# Patient Record
Sex: Female | Born: 1940 | Race: White | Hispanic: No | Marital: Married | Smoking: Never smoker
Health system: Southern US, Community
[De-identification: ages and names within clinical notes are randomized; demographics above are authoritative.]

## PROBLEM LIST (undated history)

## (undated) DIAGNOSIS — I10 Essential (primary) hypertension: Secondary | ICD-10-CM

## (undated) DIAGNOSIS — S7401XA 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, right leg, initial encounter: Secondary | ICD-10-CM

## (undated) DIAGNOSIS — Z8711 Personal history of peptic ulcer disease: Secondary | ICD-10-CM

## (undated) DIAGNOSIS — R079 Chest pain, unspecified: Secondary | ICD-10-CM

## (undated) DIAGNOSIS — A833 St Louis encephalitis: Secondary | ICD-10-CM

## (undated) DIAGNOSIS — M152 Bouchard's nodes (with arthropathy): Secondary | ICD-10-CM

## (undated) DIAGNOSIS — Z8719 Personal history of other diseases of the digestive system: Secondary | ICD-10-CM

## (undated) DIAGNOSIS — J302 Other seasonal allergic rhinitis: Secondary | ICD-10-CM

## (undated) DIAGNOSIS — E785 Hyperlipidemia, unspecified: Secondary | ICD-10-CM

## (undated) DIAGNOSIS — K5792 Diverticulitis of intestine, part unspecified, without perforation or abscess without bleeding: Secondary | ICD-10-CM

## (undated) DIAGNOSIS — K5730 Diverticulosis of large intestine without perforation or abscess without bleeding: Secondary | ICD-10-CM

## (undated) DIAGNOSIS — M5136 Other intervertebral disc degeneration, lumbar region: Secondary | ICD-10-CM

## (undated) DIAGNOSIS — M51369 Other intervertebral disc degeneration, lumbar region without mention of lumbar back pain or lower extremity pain: Secondary | ICD-10-CM

## (undated) DIAGNOSIS — M3500 Sicca syndrome, unspecified: Secondary | ICD-10-CM

## (undated) DIAGNOSIS — G4733 Obstructive sleep apnea (adult) (pediatric): Secondary | ICD-10-CM

## (undated) DIAGNOSIS — D126 Benign neoplasm of colon, unspecified: Secondary | ICD-10-CM

## (undated) DIAGNOSIS — M1990 Unspecified osteoarthritis, unspecified site: Secondary | ICD-10-CM

## (undated) DIAGNOSIS — H04129 Dry eye syndrome of unspecified lacrimal gland: Secondary | ICD-10-CM

## (undated) DIAGNOSIS — M329 Systemic lupus erythematosus, unspecified: Secondary | ICD-10-CM

## (undated) HISTORY — DX: Chest pain, unspecified: R07.9

## (undated) HISTORY — DX: Personal history of other diseases of the digestive system: Z87.19

## (undated) HISTORY — DX: Bouchard's nodes (with arthropathy): M15.2

## (undated) HISTORY — PX: TOTAL ABDOMINAL HYSTERECTOMY: SHX209

## (undated) HISTORY — DX: Other seasonal allergic rhinitis: J30.2

## (undated) HISTORY — DX: Diverticulosis of large intestine without perforation or abscess without bleeding: K57.30

## (undated) HISTORY — DX: Diverticulitis of intestine, part unspecified, without perforation or abscess without bleeding: K57.92

## (undated) HISTORY — DX: St Louis encephalitis: A83.3

## (undated) HISTORY — DX: Personal history of peptic ulcer disease: Z87.11

## (undated) HISTORY — DX: Obstructive sleep apnea (adult) (pediatric): G47.33

## (undated) HISTORY — DX: Other intervertebral disc degeneration, lumbar region without mention of lumbar back pain or lower extremity pain: M51.369

## (undated) HISTORY — DX: Essential (primary) hypertension: I10

## (undated) HISTORY — PX: CARPAL TUNNEL RELEASE: SHX101

## (undated) HISTORY — PX: REDUCTION MAMMAPLASTY: SUR839

## (undated) HISTORY — DX: Hyperlipidemia, unspecified: E78.5

## (undated) HISTORY — PX: COLONOSCOPY: SHX174

## (undated) HISTORY — DX: 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, right leg, initial encounter: S74.01XA

## (undated) HISTORY — DX: Benign neoplasm of colon, unspecified: D12.6

## (undated) HISTORY — PX: CHOLECYSTECTOMY: SHX55

## (undated) HISTORY — PX: DILATION AND CURETTAGE OF UTERUS: SHX78

## (undated) HISTORY — DX: Other intervertebral disc degeneration, lumbar region: M51.36

## (undated) HISTORY — PX: CYSTOCELE REPAIR: SHX163

## (undated) HISTORY — DX: Dry eye syndrome of unspecified lacrimal gland: H04.129

---

## 1997-08-06 ENCOUNTER — Inpatient Hospital Stay (HOSPITAL_COMMUNITY): Admission: EM | Admit: 1997-08-06 | Discharge: 1997-08-07 | Payer: Self-pay | Admitting: Urology

## 2000-01-23 DIAGNOSIS — R079 Chest pain, unspecified: Secondary | ICD-10-CM

## 2000-01-23 DIAGNOSIS — D126 Benign neoplasm of colon, unspecified: Secondary | ICD-10-CM

## 2000-01-23 HISTORY — DX: Benign neoplasm of colon, unspecified: D12.6

## 2000-01-23 HISTORY — DX: Chest pain, unspecified: R07.9

## 2000-09-20 ENCOUNTER — Encounter: Admission: RE | Admit: 2000-09-20 | Discharge: 2000-09-20 | Payer: Self-pay | Admitting: Internal Medicine

## 2000-09-20 ENCOUNTER — Encounter: Payer: Self-pay | Admitting: Internal Medicine

## 2000-10-17 ENCOUNTER — Encounter: Admission: RE | Admit: 2000-10-17 | Discharge: 2000-10-17 | Payer: Self-pay | Admitting: Internal Medicine

## 2000-10-17 ENCOUNTER — Encounter: Payer: Self-pay | Admitting: Internal Medicine

## 2000-11-18 ENCOUNTER — Encounter (INDEPENDENT_AMBULATORY_CARE_PROVIDER_SITE_OTHER): Payer: Self-pay

## 2000-11-18 ENCOUNTER — Ambulatory Visit (HOSPITAL_COMMUNITY): Admission: RE | Admit: 2000-11-18 | Discharge: 2000-11-18 | Payer: Self-pay | Admitting: Gastroenterology

## 2001-11-14 ENCOUNTER — Encounter: Admission: RE | Admit: 2001-11-14 | Discharge: 2001-11-14 | Payer: Self-pay | Admitting: Internal Medicine

## 2001-11-14 ENCOUNTER — Encounter: Payer: Self-pay | Admitting: Internal Medicine

## 2002-11-12 ENCOUNTER — Ambulatory Visit (HOSPITAL_COMMUNITY): Admission: RE | Admit: 2002-11-12 | Discharge: 2002-11-12 | Payer: Self-pay | Admitting: Plastic Surgery

## 2002-11-16 ENCOUNTER — Encounter: Payer: Self-pay | Admitting: Plastic Surgery

## 2002-11-16 ENCOUNTER — Encounter: Admission: RE | Admit: 2002-11-16 | Discharge: 2002-11-16 | Payer: Self-pay | Admitting: Plastic Surgery

## 2002-12-23 HISTORY — PX: BREAST REDUCTION SURGERY: SHX8

## 2003-07-23 HISTORY — PX: OTHER SURGICAL HISTORY: SHX169

## 2003-11-17 ENCOUNTER — Encounter: Admission: RE | Admit: 2003-11-17 | Discharge: 2003-11-17 | Payer: Self-pay | Admitting: Internal Medicine

## 2004-01-23 DIAGNOSIS — H04129 Dry eye syndrome of unspecified lacrimal gland: Secondary | ICD-10-CM

## 2004-01-23 HISTORY — DX: Dry eye syndrome of unspecified lacrimal gland: H04.129

## 2004-05-30 ENCOUNTER — Encounter: Admission: RE | Admit: 2004-05-30 | Discharge: 2004-05-30 | Payer: Self-pay | Admitting: Gastroenterology

## 2004-11-17 ENCOUNTER — Encounter: Admission: RE | Admit: 2004-11-17 | Discharge: 2004-11-17 | Payer: Self-pay | Admitting: Internal Medicine

## 2004-11-30 ENCOUNTER — Encounter: Admission: RE | Admit: 2004-11-30 | Discharge: 2004-11-30 | Payer: Self-pay | Admitting: Internal Medicine

## 2005-05-30 ENCOUNTER — Encounter: Admission: RE | Admit: 2005-05-30 | Discharge: 2005-05-30 | Payer: Self-pay | Admitting: Internal Medicine

## 2005-11-19 ENCOUNTER — Encounter: Admission: RE | Admit: 2005-11-19 | Discharge: 2005-11-19 | Payer: Self-pay | Admitting: Internal Medicine

## 2006-04-12 ENCOUNTER — Encounter: Admission: RE | Admit: 2006-04-12 | Discharge: 2006-04-12 | Payer: Self-pay | Admitting: Internal Medicine

## 2006-05-23 ENCOUNTER — Encounter: Admission: RE | Admit: 2006-05-23 | Discharge: 2006-05-23 | Payer: Self-pay | Admitting: Neurosurgery

## 2006-11-28 ENCOUNTER — Encounter: Admission: RE | Admit: 2006-11-28 | Discharge: 2006-11-28 | Payer: Self-pay | Admitting: Internal Medicine

## 2007-12-01 ENCOUNTER — Encounter: Admission: RE | Admit: 2007-12-01 | Discharge: 2007-12-01 | Payer: Self-pay | Admitting: Internal Medicine

## 2007-12-24 ENCOUNTER — Encounter: Admission: RE | Admit: 2007-12-24 | Discharge: 2007-12-24 | Payer: Self-pay | Admitting: Internal Medicine

## 2008-12-01 ENCOUNTER — Encounter: Admission: RE | Admit: 2008-12-01 | Discharge: 2008-12-01 | Payer: Self-pay | Admitting: Internal Medicine

## 2009-12-06 ENCOUNTER — Encounter: Admission: RE | Admit: 2009-12-06 | Discharge: 2009-12-06 | Payer: Self-pay | Admitting: Internal Medicine

## 2010-02-12 ENCOUNTER — Encounter: Payer: Self-pay | Admitting: Gastroenterology

## 2010-06-09 NOTE — Procedures (Signed)
[facility]  Patient:    Kerry Foster, LESH Visit Number: [id number] MRN: [id number]          Service Type: Attending:  Verlin Grills, M.D. Dictated by:   Verlin Grills, M.D. Proc. Date: 11/18/00   CC:         Thora Lance, M.D.                           Procedure Report  PROCEDURE:  Colonoscopy and sigmoid polypectomy.  REFERRING PHYSICIAN:  Thora Lance, M.D.  INDICATIONS FOR PROCEDURE:  The patient (date of birth July 15, 1940) is a 70 year old female who is referred for surveillance colonoscopy with polypectomy to prevent colon cancer.  The patients father has undergone colonoscopic exams to remove colon polyps.  She denies a family history of colon cancer.  I discussed with the patient the complications associated with colonoscopy and polypectomy including a 15 per 1000 risk of bleeding and 4 per 1000 risk of colon perforation requiring surgical repair.  The patient has signed the operative permit.  ENDOSCOPIST:  Verlin Grills, M.D.  PREMEDICATION:  Versed 5 mg and Demerol 50 mg.  ENDOSCOPE:  Olympus pediatric colonoscope.  DESCRIPTION OF PROCEDURE:  After obtaining informed consent, the patient was placed in the left lateral decubitus position.  I administered intravenous Versed and intravenous Demerol to achieve conscious sedation for the procedure.  The patients blood pressure, oxygen saturation and cardiac rhythm were monitored throughout the procedure and documented in the medical record.  Anal inspection was normal.  Digital rectal exam was normal.  The Olympus pediatric video colonoscope was introduced into the rectum and easily advanced to the cecum with the patient in the left lateral decubitus position.  Colonic preparation for the exam today was excellent.  The patient has universal colonic diverticulosis without evidence of diverticulitis or diverticular stricture.  Rectum normal.  Sigmoid  colon and descending colon:  From the distal sigmoid colon, at 30 cm from the anal verge, a 2 mm sessile polyp was removed with the electrocautery snare and submitted for pathologic interpretation.  Splenic flexure normal.  Transverse colon normal.  Hepatic flexure normal.  Ascending colon normal.  Cecum and ileocecal valve normal.  ASSESSMENT: 1. Universal colonic diverticulosis. 2. From the distal sigmoid colon, at 30 cm from the anal verge, a 2 mm sessile    polyp was removed with electrocautery snare.  RECOMMENDATIONS:  If the distal sigmoid colon polyp returns neoplastic pathologically, the patient should undergo a repeat colonoscopy in five years. Dictated by:   Verlin Grills, M.D. Attending:  Verlin Grills, M.D. DD:  11/18/00 TD:  11/19/00 Job: [id number]

## 2010-11-06 ENCOUNTER — Other Ambulatory Visit: Payer: Self-pay | Admitting: Internal Medicine

## 2010-11-06 DIAGNOSIS — Z1231 Encounter for screening mammogram for malignant neoplasm of breast: Secondary | ICD-10-CM

## 2010-12-08 ENCOUNTER — Ambulatory Visit
Admission: RE | Admit: 2010-12-08 | Discharge: 2010-12-08 | Disposition: A | Discharge status: Home / Self Care | Payer: PRIVATE HEALTH INSURANCE | Source: Ambulatory Visit | Attending: Internal Medicine | Admitting: Internal Medicine

## 2010-12-08 DIAGNOSIS — Z1231 Encounter for screening mammogram for malignant neoplasm of breast: Secondary | ICD-10-CM

## 2010-12-12 ENCOUNTER — Other Ambulatory Visit: Payer: Self-pay | Admitting: Gastroenterology

## 2011-10-29 ENCOUNTER — Other Ambulatory Visit: Payer: Self-pay | Admitting: Internal Medicine

## 2011-10-29 DIAGNOSIS — Z1231 Encounter for screening mammogram for malignant neoplasm of breast: Secondary | ICD-10-CM

## 2011-12-10 ENCOUNTER — Ambulatory Visit
Admission: RE | Admit: 2011-12-10 | Discharge: 2011-12-10 | Disposition: A | Discharge status: Home / Self Care | Payer: Medicare Other | Source: Ambulatory Visit | Attending: Internal Medicine | Admitting: Internal Medicine

## 2011-12-10 DIAGNOSIS — Z1231 Encounter for screening mammogram for malignant neoplasm of breast: Secondary | ICD-10-CM

## 2012-11-11 ENCOUNTER — Other Ambulatory Visit: Payer: Self-pay

## 2012-11-11 DIAGNOSIS — Z1231 Encounter for screening mammogram for malignant neoplasm of breast: Secondary | ICD-10-CM

## 2012-12-12 ENCOUNTER — Ambulatory Visit
Admission: RE | Admit: 2012-12-12 | Discharge: 2012-12-12 | Disposition: A | Discharge status: Home / Self Care | Payer: Medicare Other | Source: Ambulatory Visit

## 2012-12-12 DIAGNOSIS — Z1231 Encounter for screening mammogram for malignant neoplasm of breast: Secondary | ICD-10-CM

## 2013-11-09 ENCOUNTER — Other Ambulatory Visit: Payer: Self-pay

## 2013-11-09 DIAGNOSIS — Z1231 Encounter for screening mammogram for malignant neoplasm of breast: Secondary | ICD-10-CM

## 2013-12-14 ENCOUNTER — Ambulatory Visit
Admission: RE | Admit: 2013-12-14 | Discharge: 2013-12-14 | Disposition: A | Discharge status: Home / Self Care | Payer: 59 | Source: Ambulatory Visit

## 2013-12-14 DIAGNOSIS — Z1231 Encounter for screening mammogram for malignant neoplasm of breast: Secondary | ICD-10-CM

## 2014-02-22 ENCOUNTER — Other Ambulatory Visit: Payer: Self-pay | Admitting: Otolaryngology

## 2014-02-22 DIAGNOSIS — R221 Localized swelling, mass and lump, neck: Secondary | ICD-10-CM

## 2014-03-02 ENCOUNTER — Ambulatory Visit
Admission: RE | Admit: 2014-03-02 | Discharge: 2014-03-02 | Disposition: A | Discharge status: Home / Self Care | Payer: Medicare Other | Source: Ambulatory Visit | Attending: Otolaryngology | Admitting: Otolaryngology

## 2014-03-02 DIAGNOSIS — R221 Localized swelling, mass and lump, neck: Secondary | ICD-10-CM

## 2014-03-02 MED ORDER — IOHEXOL 300 MG/ML  SOLN: 75.0000 mL | Freq: Once | INTRAMUSCULAR | Status: AC | PRN

## 2014-10-18 DIAGNOSIS — I10 Essential (primary) hypertension: Secondary | ICD-10-CM | POA: Insufficient documentation

## 2014-10-26 ENCOUNTER — Encounter: Payer: Self-pay | Admitting: Cardiology

## 2014-10-28 ENCOUNTER — Encounter: Payer: Self-pay | Admitting: Cardiology

## 2014-10-28 DIAGNOSIS — R079 Chest pain, unspecified: Secondary | ICD-10-CM | POA: Insufficient documentation

## 2014-10-28 DIAGNOSIS — I10 Essential (primary) hypertension: Secondary | ICD-10-CM | POA: Insufficient documentation

## 2014-11-03 ENCOUNTER — Ambulatory Visit: Payer: Medicare Other | Admitting: Cardiology

## 2014-11-05 ENCOUNTER — Encounter: Payer: Self-pay | Admitting: Cardiology

## 2014-11-05 ENCOUNTER — Ambulatory Visit (INDEPENDENT_AMBULATORY_CARE_PROVIDER_SITE_OTHER): Payer: Medicare Other | Admitting: Cardiology

## 2014-11-05 VITALS — BP 140/50 | HR 55 | Ht 63.5 in | Wt 181.0 lb

## 2014-11-05 DIAGNOSIS — E785 Hyperlipidemia, unspecified: Secondary | ICD-10-CM

## 2014-11-05 DIAGNOSIS — I10 Essential (primary) hypertension: Secondary | ICD-10-CM

## 2014-11-05 DIAGNOSIS — R0609 Other forms of dyspnea: Secondary | ICD-10-CM

## 2014-11-05 DIAGNOSIS — R072 Precordial pain: Secondary | ICD-10-CM | POA: Diagnosis not present

## 2014-11-05 DIAGNOSIS — M3500 Sicca syndrome, unspecified: Secondary | ICD-10-CM

## 2014-11-05 DIAGNOSIS — Z8249 Family history of ischemic heart disease and other diseases of the circulatory system: Secondary | ICD-10-CM

## 2014-11-05 DIAGNOSIS — M329 Systemic lupus erythematosus, unspecified: Secondary | ICD-10-CM

## 2014-11-05 NOTE — Progress Notes (Signed)
Patient ID: Kerry Foster, female   DOB: 02-15-1940, 74 y.o.   MRN: [id number]      Cardiology Office Note  Date:  11/05/2014   ID:  Kerry, Foster January 24, 1940, MRN [id number]  PCP:  Kerry Shelling, MD  Cardiologist: Kerry Spark, MD   Chief complain: jaw pain   History of Present Illness: Kerry Foster is a 75 y.o. female with h/o borderline elevated lipids, treated hypertension, who presents for evaluation of exertional jaw pain, that occurs on minimal exertion like walking in a grocery store or cleaning dishes. She has h/o SLE and Sjogren disease on Plaquenil. She was exercising in the past but not lately as she was having problems with joints pain and bursitis. She has noticed DOE. She has occasional rapid beats followed by slow HR, not associated with dizziness or syncope. No LE edema, orthopnea or PND.  Her father had several MIs, the first at age 6. Brother underwent RFA for a-fib.  Past Medical History  Diagnosis Date  . Hypertension   . Dry eye syndrome 2006    plugs inserted and tear ducts  (hecker)  . 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 Lumbar degenerative disc disease     facet DJD.Joya Salm)  . Hx of gastroesophageal reflux (GERD)     hiatal hernia  . Moderate obstructive sleep apnea     CPAP  . Bouchard nodes (DJD hand)   . Hyperlipidemia   . Pancolonic diverticulosis     several episodes  . Diverticulitis   . Adenomatous colon polyp 2002  . Chest pain 2002    negative cardiolite  . Hx of peptic ulcer   . Seasonal allergic rhinitis   . 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 Total abdominal hysterectomy      without oophorectomy, fibroids and endometriosis age 48  .  Dilation and curettage of uterus      prior to hysterectomy  . Cholecystectomy    . Carpal tunnel release Right     carpal tunnel repair  . Cystocele repair    . Breast reduction surgery  12/2002    Holernes  . Pre melanoma Left 07/2003    left back   . Colonoscopy      with polyp resection     Current Outpatient Prescriptions  Medication Sig Dispense Refill  . amLODipine (NORVASC) 5 MG tablet Take 5 mg by mouth daily.    Marland Kitchen aspirin EC 81 MG tablet Take 81 mg by mouth daily.     . cholecalciferol (VITAMIN D) 1000 UNITS tablet Take 1,000 Units by mouth daily.    . cycloSPORINE (RESTASIS) 0.05 % ophthalmic emulsion Place 1 drop into both eyes 2 (two) times daily.    . hydroxychloroquine (PLAQUENIL) 200 MG tablet Take by mouth 2 (two) times daily.    . Ibuprofen (ADVIL) 200 MG CAPS Take 200 mg by mouth every 6 (six) hours as needed (pain).     . Multiple Vitamin (MULTIVITAMIN) tablet Take 1 tablet by mouth daily.    Marland Kitchen NITROSTAT 0.4 MG SL tablet Place 0.4 mg under the tongue every 5 (five) minutes as needed for chest pain.      No current  facility-administered medications for this visit.    Allergies:   Diuretic; Lisinopril; and Diovan    Social History:  The patient  reports that she has never smoked. She does not have any smokeless tobacco history on file. She reports that she does not drink alcohol or use illicit drugs.   Family History:  The patient's family history includes Aneurysm in her mother and mother; CAD in her father; CVA in her mother; Colon polyps in her father; Diabetes in her father; Diabetes Mellitus I in her brother; Fibromyalgia in her sister; Heart attack in her father; Hypertension in her brother, brother, and father; Kidney failure in her father; Lupus in her sister; Melanoma in her brother, brother, and sister; Pancreatitis in her mother; Prostate cancer in her brother; Sleep apnea in her brother and sister; Stroke in her father and mother.    ROS:  Please see  the history of present illness.   Otherwise, review of systems are positive for none.   All other systems are reviewed and negative.   PHYSICAL EXAM: VS:  BP 140/50 mmHg  Pulse 55  Ht 5' 3.5" (1.613 m)  Wt 181 lb (82.101 kg)  BMI 31.56 kg/m2 , BMI Body mass index is 31.56 kg/(m^2). GEN: Well nourished, well developed, in no acute distress HEENT: normal Neck: no JVD, carotid bruits, or masses Cardiac: RRR; no murmurs, rubs, or gallops,no edema  Respiratory:  clear to auscultation bilaterally, normal work of breathing GI: soft, nontender, nondistended, + BS MS: no deformity or atrophy Skin: warm and dry, no rash Neuro:  Strength and sensation are intact Psych: euthymic mood, full affect  EKG:  EKG is ordered today. The ekg ordered today demonstrates SB, low voltage ECG.   Recent Labs: No results found for requested labs within last 365 days.   Lipid Panel No results found for: CHOL, TRIG, HDL, CHOLHDL, VLDL, LDLCALC, LDLDIRECT   Wt Readings from Last 3 Encounters:  11/05/14 181 lb (82.101 kg)     ASSESSMENT AND PLAN:  1.  Chest pain, DOE - risk factors include her age, FH of premature CAD, HTN, HLP and SLE. We will schedule a Lexiscan nuclear stress test.  2. DOE - low voltage ECG< schedule an echocardiogram to evaluate for systolic and diastolic function.   3. Hyperlipidemia - we will check lipids and CMP.  Follow up in 1 month.  Signed, Kerry Spark, MD  11/05/2014 9:10 AM    [facility] [place], [place], [place]  [address] Phone: [phone]; Fax: [phone]

## 2014-11-05 NOTE — Patient Instructions (Addendum)
Medication Instructions:   Your physician recommends that you continue on your current medications as directed. Please refer to the Current Medication list given to you today.    Labwork:  ONE MONTH, SAME DAY AS YOUR ONE MONTH FOLLOW-UP APPOINTMENT WITH DR Meda Coffee TO CHECK A CMET AND LIPIDS---PLEASE COME FASTING TO THIS LAB APPOINTMENT    Testing/Procedures:  Your physician has requested that you have an echocardiogram. Echocardiography is a painless test that uses sound waves to create images of your heart. It provides your doctor with information about the size and shape of your heart and how well your heart's chambers and valves are working. This procedure takes approximately one hour. There are no restrictions for this procedure.    Your physician has requested that you have a lexiscan myoview. For further information please visit [email or web address]. Please follow instruction sheet, as given.     Follow-Up:  [place] Meda Coffee

## 2014-11-10 ENCOUNTER — Other Ambulatory Visit: Payer: Self-pay

## 2014-11-10 DIAGNOSIS — Z1231 Encounter for screening mammogram for malignant neoplasm of breast: Secondary | ICD-10-CM

## 2014-11-17 ENCOUNTER — Telehealth (HOSPITAL_COMMUNITY): Payer: Self-pay | Admitting: *Deleted

## 2014-11-17 NOTE — Telephone Encounter (Signed)
Attempted to call patient regarding upcoming appointment- no answer x 2. Amyjo Mizrachi J Edison Nicholson, RN 

## 2014-11-18 ENCOUNTER — Telehealth (HOSPITAL_COMMUNITY): Payer: Self-pay

## 2014-11-18 NOTE — Telephone Encounter (Signed)
Attempted to call the patient regarding upcoming appointments with no answer x numerous rings, then Foster recording to please enter remote access code. Kerry Foster, Kerry Foster

## 2014-11-22 ENCOUNTER — Ambulatory Visit (HOSPITAL_COMMUNITY): Payer: Medicare Other | Attending: Internal Medicine

## 2014-11-22 ENCOUNTER — Ambulatory Visit (HOSPITAL_BASED_OUTPATIENT_CLINIC_OR_DEPARTMENT_OTHER): Payer: Medicare Other

## 2014-11-22 ENCOUNTER — Other Ambulatory Visit: Payer: Self-pay

## 2014-11-22 DIAGNOSIS — R0609 Other forms of dyspnea: Secondary | ICD-10-CM | POA: Diagnosis not present

## 2014-11-22 DIAGNOSIS — R002 Palpitations: Secondary | ICD-10-CM | POA: Diagnosis not present

## 2014-11-22 DIAGNOSIS — I071 Rheumatic tricuspid insufficiency: Secondary | ICD-10-CM | POA: Insufficient documentation

## 2014-11-22 DIAGNOSIS — I340 Nonrheumatic mitral (valve) insufficiency: Secondary | ICD-10-CM | POA: Insufficient documentation

## 2014-11-22 DIAGNOSIS — R072 Precordial pain: Secondary | ICD-10-CM | POA: Insufficient documentation

## 2014-11-22 DIAGNOSIS — E785 Hyperlipidemia, unspecified: Secondary | ICD-10-CM | POA: Insufficient documentation

## 2014-11-22 DIAGNOSIS — I517 Cardiomegaly: Secondary | ICD-10-CM | POA: Diagnosis not present

## 2014-11-22 DIAGNOSIS — I10 Essential (primary) hypertension: Secondary | ICD-10-CM

## 2014-11-22 DIAGNOSIS — Z8249 Family history of ischemic heart disease and other diseases of the circulatory system: Secondary | ICD-10-CM | POA: Insufficient documentation

## 2014-11-22 DIAGNOSIS — I371 Nonrheumatic pulmonary valve insufficiency: Secondary | ICD-10-CM | POA: Insufficient documentation

## 2014-11-22 DIAGNOSIS — I5189 Other ill-defined heart diseases: Secondary | ICD-10-CM | POA: Insufficient documentation

## 2014-11-22 LAB — MYOCARDIAL PERFUSION IMAGING
LV dias vol: 73 mL
LV sys vol: 17 mL
Peak HR: 76 {beats}/min
RATE: 0.29
Rest HR: 54 {beats}/min
SDS: 0
SRS: 1
SSS: 1
TID: 0.88

## 2014-11-22 MED ORDER — REGADENOSON 0.4 MG/5ML IV SOLN
0.4000 mg | Freq: Once | INTRAVENOUS | Status: AC
  Administered 2014-11-22: 0.4 mg via INTRAVENOUS

## 2014-11-22 MED ORDER — TECHNETIUM TC 99M SESTAMIBI GENERIC - CARDIOLITE
11.0000 | Freq: Once | INTRAVENOUS | Status: AC | PRN
  Administered 2014-11-22: 11 via INTRAVENOUS

## 2014-11-22 MED ORDER — TECHNETIUM TC 99M SESTAMIBI GENERIC - CARDIOLITE
30.6000 | Freq: Once | INTRAVENOUS | Status: AC | PRN
  Administered 2014-11-22: 31 via INTRAVENOUS

## 2014-11-23 ENCOUNTER — Telehealth: Payer: Self-pay | Admitting: *Deleted

## 2014-11-23 MED ORDER — FUROSEMIDE 20 MG PO TABS: 20.0000 mg | ORAL_TABLET | Freq: Every day | ORAL | Status: DC

## 2014-11-23 NOTE — Telephone Encounter (Signed)
Notified the pt that per Dr Meda Coffee, her stress test was normal, and her echo showed normal LV Function, but LVH and elevated filling pressures.  Informed the pt that per Dr Meda Coffee, she recommends the pt start a low dose of a diuretic, but her records indicate that she is allergic to it.  Informed the pt that Dr Meda Coffee wanted me to clarify exactly which diuretic she is allergic too, for her options are HCTZ or Lasix.  Informed the pt that per Dr Meda Coffee, if she is allergic to HCTZ, then we will prescribe her lasix 20 mg po daily.  Informed the pt that if she allergic to Lasix, then we will start her on HCTZ 25 mg po daily.  Per the pt her allergies include:  HCTZ, Diovan, and lisinopril.  Informed the pt that given this information, we will start her on lasix 20 mg po daily.  Confirmed the pharmacy of choice with the pt.  Advised the pt that if she feels that she cannot tolerate this med with her other Lupus meds, then she should contact our office to notify us of this, and to update her allergy list.  Pt verbalized understanding and agrees with this plan.

## 2014-11-23 NOTE — Telephone Encounter (Signed)
-----   Message from Dorothy Spark, MD sent at 11/23/2014 10:17 AM EDT ----- She has normal stress test, her echocardiogram shows normal LV function, but LVH and elevated filling pressures = I would like to start low dose of a diuretic, but her records states that she is allrgic to it - please ask which one, the options for diuretics are hydrochlorothiazide 25 mg po daily or lasix 20 mg po daily.

## 2014-11-30 ENCOUNTER — Encounter: Payer: Self-pay | Admitting: Cardiology

## 2014-12-01 ENCOUNTER — Ambulatory Visit (INDEPENDENT_AMBULATORY_CARE_PROVIDER_SITE_OTHER): Payer: Medicare Other | Admitting: Cardiology

## 2014-12-01 ENCOUNTER — Other Ambulatory Visit (INDEPENDENT_AMBULATORY_CARE_PROVIDER_SITE_OTHER): Payer: Medicare Other | Admitting: *Deleted

## 2014-12-01 ENCOUNTER — Encounter: Payer: Self-pay | Admitting: Cardiology

## 2014-12-01 VITALS — BP 122/78 | HR 60 | Ht 63.5 in | Wt 181.0 lb

## 2014-12-01 DIAGNOSIS — E669 Obesity, unspecified: Secondary | ICD-10-CM | POA: Insufficient documentation

## 2014-12-01 DIAGNOSIS — J302 Other seasonal allergic rhinitis: Secondary | ICD-10-CM | POA: Insufficient documentation

## 2014-12-01 DIAGNOSIS — M152 Bouchard's nodes (with arthropathy): Secondary | ICD-10-CM | POA: Insufficient documentation

## 2014-12-01 DIAGNOSIS — I10 Essential (primary) hypertension: Secondary | ICD-10-CM

## 2014-12-01 DIAGNOSIS — M329 Systemic lupus erythematosus, unspecified: Secondary | ICD-10-CM | POA: Insufficient documentation

## 2014-12-01 DIAGNOSIS — R0609 Other forms of dyspnea: Secondary | ICD-10-CM

## 2014-12-01 DIAGNOSIS — D126 Benign neoplasm of colon, unspecified: Secondary | ICD-10-CM | POA: Insufficient documentation

## 2014-12-01 DIAGNOSIS — I5032 Chronic diastolic (congestive) heart failure: Secondary | ICD-10-CM

## 2014-12-01 DIAGNOSIS — R072 Precordial pain: Secondary | ICD-10-CM

## 2014-12-01 DIAGNOSIS — E7800 Pure hypercholesterolemia, unspecified: Secondary | ICD-10-CM | POA: Diagnosis not present

## 2014-12-01 DIAGNOSIS — I208 Other forms of angina pectoris: Secondary | ICD-10-CM | POA: Insufficient documentation

## 2014-12-01 DIAGNOSIS — M5136 Other intervertebral disc degeneration, lumbar region: Secondary | ICD-10-CM | POA: Insufficient documentation

## 2014-12-01 DIAGNOSIS — K219 Gastro-esophageal reflux disease without esophagitis: Secondary | ICD-10-CM | POA: Insufficient documentation

## 2014-12-01 DIAGNOSIS — M3500 Sicca syndrome, unspecified: Secondary | ICD-10-CM | POA: Insufficient documentation

## 2014-12-01 DIAGNOSIS — G4733 Obstructive sleep apnea (adult) (pediatric): Secondary | ICD-10-CM | POA: Insufficient documentation

## 2014-12-01 DIAGNOSIS — K5790 Diverticulosis of intestine, part unspecified, without perforation or abscess without bleeding: Secondary | ICD-10-CM | POA: Insufficient documentation

## 2014-12-01 DIAGNOSIS — K5792 Diverticulitis of intestine, part unspecified, without perforation or abscess without bleeding: Secondary | ICD-10-CM | POA: Insufficient documentation

## 2014-12-01 LAB — BASIC METABOLIC PANEL
BUN: 21 mg/dL (ref 7–25)
CO2: 26 mmol/L (ref 20–31)
Calcium: 9.5 mg/dL (ref 8.6–10.4)
Chloride: 107 mmol/L (ref 98–110)
Creat: 0.75 mg/dL (ref 0.60–0.93)
Glucose, Bld: 92 mg/dL (ref 65–99)
Potassium: 3.8 mmol/L (ref 3.5–5.3)
Sodium: 139 mmol/L (ref 135–146)

## 2014-12-01 LAB — LIPID PANEL
Cholesterol: 179 mg/dL (ref 125–200)
HDL: 73 mg/dL (ref >=46)
LDL Cholesterol: 89 mg/dL (ref <130)
Total CHOL/HDL Ratio: 2.5 Ratio (ref <=5.0)
Triglycerides: 86 mg/dL (ref <150)
VLDL: 17 mg/dL (ref <30)

## 2014-12-01 NOTE — Progress Notes (Signed)
Patient ID: Kerry Foster, female   DOB: [birth year], 74 y.o.   MRN: [id number]      Cardiology Office Note  Date:  12/01/2014   ID:  Kerry Foster 1940-02-13, MRN [id number]  PCP:  Irven Shelling, MD  Cardiologist: Dorothy Spark, MD   Chief complain: jaw pain   History of Present Illness: Kerry Foster is a 74 y.o. female with h/o borderline elevated lipids, treated hypertension, who presents for evaluation of exertional jaw pain, that occurs on minimal exertion like walking in a grocery store or cleaning dishes. She has h/o SLE and Sjogren disease on Plaquenil. She was exercising in the past but not lately as she was having problems with joints pain and bursitis. She has noticed DOE. She has occasional rapid beats followed by slow HR, not associated with dizziness or syncope. No LE edema, orthopnea or PND.  Her father had several MIs, the first at age 64. Brother underwent RFA for a-fib. 1 month follow up, she denies chest pain, she is tearful about her daughter in law dealing with terminal cancer and happy about expecting the first grandchild. She had a normal stress test and normal LVEF, mild LVH and DD 1 with elevated filling pressures, started on lasix 20 mg po daily.  Past Medical History  Diagnosis Date  . Hypertension   . Dry eye syndrome 2006    plugs inserted and tear ducts  (hecker)  . 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 Lumbar degenerative disc disease     facet DJD.Joya Salm)  . Hx of gastroesophageal reflux (GERD)     hiatal hernia  . Moderate obstructive sleep apnea     CPAP  . Bouchard nodes (DJD hand)   . Hyperlipidemia   . Pancolonic diverticulosis     several episodes  . Diverticulitis   . Adenomatous colon polyp 2002  . Chest pain 2002    negative cardiolite  . Hx of peptic ulcer   . Seasonal allergic rhinitis   . 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 Total abdominal hysterectomy      without oophorectomy, fibroids and endometriosis age 42  . Dilation and curettage of uterus      prior to hysterectomy  . Cholecystectomy    . Carpal tunnel release Right     carpal tunnel repair  . Cystocele repair    . Breast reduction surgery  12/2002    Holernes  . Pre melanoma Left 07/2003    left back   . Colonoscopy      with polyp resection     Current Outpatient Prescriptions  Medication Sig Dispense Refill  . amLODipine (NORVASC) 5 MG tablet Take 5 mg by mouth daily.    Marland Kitchen aspirin EC 81 MG tablet Take 81 mg by mouth daily.     . cholecalciferol (VITAMIN D) 1000 UNITS tablet Take 1,000 Units by mouth daily.    . cycloSPORINE (RESTASIS) 0.05 % ophthalmic emulsion Place 1 drop into both eyes 2 (two) times daily.    . furosemide (LASIX) 20 MG tablet Take 1 tablet (20 mg total) by mouth daily. 90 tablet 3  . hydroxychloroquine (PLAQUENIL) 200 MG tablet Take by mouth  2 (two) times daily.    . Ibuprofen (ADVIL) 200 MG CAPS Take 200 mg by mouth every 6 (six) hours as needed (pain).     . Multiple Vitamin (MULTIVITAMIN) tablet Take 1 tablet by mouth daily.    Marland Kitchen NITROSTAT 0.4 MG SL tablet Place 0.4 mg under the tongue every 5 (five) minutes as needed for chest pain.      No current facility-administered medications for this visit.    Allergies:   Diuretic; Hctz; Lisinopril; and Diovan    Social History:  The patient  reports that she has never smoked. She does not have any smokeless tobacco history on file. She reports that she does not drink alcohol or use illicit drugs.   Family History:  The patient's family history includes Aneurysm in her mother and mother; CAD in her father; CVA in her mother; Colon polyps in her father; Diabetes in her father;  Diabetes Mellitus I in her brother; Fibromyalgia in her sister; Heart attack in her father; Hypertension in her brother, brother, and father; Kidney failure in her father; Lupus in her sister; Melanoma in her brother, brother, and sister; Pancreatitis in her mother; Prostate cancer in her brother; Sleep apnea in her brother and sister; Stroke in her father and mother.    ROS:  Please see the history of present illness.   Otherwise, review of systems are positive for none.   All other systems are reviewed and negative.   PHYSICAL EXAM: VS:  BP 122/78 mmHg  Pulse 60  Ht 5' 3.5" (1.613 m)  Wt 181 lb (82.101 kg)  BMI 31.56 kg/m2  SpO2 96% , BMI Body mass index is 31.56 kg/(m^2). GEN: Well nourished, well developed, in no acute distress HEENT: normal Neck: no JVD, carotid bruits, or masses Cardiac: RRR; no murmurs, rubs, or gallops,no edema  Respiratory:  clear to auscultation bilaterally, normal work of breathing GI: soft, nontender, nondistended, + BS MS: no deformity or atrophy Skin: warm and dry, no rash Neuro:  Strength and sensation are intact Psych: euthymic mood, full affect  EKG:  EKG is ordered today. The ekg ordered today demonstrates SB, low voltage ECG.   Recent Labs: No results found for requested labs within last 365 days.   Lipid Panel No results found for: CHOL, TRIG, HDL, CHOLHDL, VLDL, LDLCALC, LDLDIRECT   Wt Readings from Last 3 Encounters:  12/01/14 181 lb (82.101 kg)  11/22/14 181 lb (82.101 kg)  11/05/14 181 lb (82.101 kg)      ASSESSMENT AND PLAN:  1.  Chest pain, DOE - risk factors include her age, FH of premature CAD, HTN, HLP and SLE. A Lexiscan nuclear stress test was normal.   2. DOE - low voltage ECG, echo normal LVEF and grade 1 DD with elevated filling pressures, she was started on lasix 20 mg po daily. He LE edema has improved.  3. Hyperlipidemia - we will check lipids and CMP.  Follow up in 6 months.  Signed, Dorothy Spark, MD    12/01/2014 10:26 AM    [address], [place], [place]  [address] Phone: [phone]; Fax: [phone]

## 2014-12-01 NOTE — Patient Instructions (Addendum)
Medication Instructions:   Your physician recommends that you continue on your current medications as directed. Please refer to the Current Medication list given to you today.    Labwork:  TODAY--ALREADY SCHEDULED FROM LAST OFFICE VISIT TO CHECK A BMET AND LIPIDS      Follow-Up:  Your physician wants you to follow-up in: [place] will receive a reminder letter in the mail two months in advance. If you don't receive a letter, please call our office to schedule the follow-up appointment.        If you need a refill on your cardiac medications before your next appointment, please call your pharmacy.

## 2014-12-21 ENCOUNTER — Ambulatory Visit
Admission: RE | Admit: 2014-12-21 | Discharge: 2014-12-21 | Disposition: A | Discharge status: Home / Self Care | Payer: Medicare Other | Source: Ambulatory Visit

## 2014-12-21 DIAGNOSIS — Z1231 Encounter for screening mammogram for malignant neoplasm of breast: Secondary | ICD-10-CM

## 2014-12-24 ENCOUNTER — Other Ambulatory Visit: Payer: Self-pay | Admitting: Internal Medicine

## 2014-12-24 DIAGNOSIS — R928 Other abnormal and inconclusive findings on diagnostic imaging of breast: Secondary | ICD-10-CM

## 2015-01-03 ENCOUNTER — Other Ambulatory Visit: Payer: Medicare Other

## 2015-01-13 ENCOUNTER — Ambulatory Visit
Admission: RE | Admit: 2015-01-13 | Discharge: 2015-01-13 | Disposition: A | Discharge status: Home / Self Care | Payer: Medicare Other | Source: Ambulatory Visit | Attending: Internal Medicine | Admitting: Internal Medicine

## 2015-01-13 DIAGNOSIS — R928 Other abnormal and inconclusive findings on diagnostic imaging of breast: Secondary | ICD-10-CM

## 2015-01-18 ENCOUNTER — Other Ambulatory Visit: Payer: Medicare Other

## 2015-05-30 ENCOUNTER — Ambulatory Visit (INDEPENDENT_AMBULATORY_CARE_PROVIDER_SITE_OTHER): Payer: Medicare Other | Admitting: Cardiology

## 2015-05-30 VITALS — BP 112/80 | HR 60 | Ht 63.5 in | Wt 186.4 lb

## 2015-05-30 DIAGNOSIS — R0609 Other forms of dyspnea: Secondary | ICD-10-CM

## 2015-05-30 DIAGNOSIS — E7800 Pure hypercholesterolemia, unspecified: Secondary | ICD-10-CM | POA: Diagnosis not present

## 2015-05-30 DIAGNOSIS — R0600 Dyspnea, unspecified: Secondary | ICD-10-CM

## 2015-05-30 DIAGNOSIS — I10 Essential (primary) hypertension: Secondary | ICD-10-CM

## 2015-05-30 NOTE — Patient Instructions (Signed)
Your physician recommends that you continue on your current medications as directed. Please refer to the Current Medication list given to you today.     Your physician wants you to follow-up in: 6 MONTHS WITH DR NELSON You will receive a reminder letter in the mail two months in advance. If you don't receive a letter, please call our office to schedule the follow-up appointment.  

## 2015-05-30 NOTE — Progress Notes (Signed)
Patient ID: Kerry Foster, female   DOB: 10/25/1940, 75 y.o.   MRN: [id number]      Cardiology Office Note  Date:  05/30/2015   ID:  Kerry, Foster 06/12/1940, MRN [id number]  PCP:  Irven Shelling, MD  Cardiologist: Dorothy Spark, MD   Chief complain: jaw pain   History of Present Illness: Kerry Foster is a 75 y.o. female with h/o borderline elevated lipids, treated hypertension, who presents for evaluation of exertional jaw pain, that occurs on minimal exertion like walking in a grocery store or cleaning dishes. She has h/o SLE and Sjogren disease on Plaquenil. She was exercising in the past but not lately as she was having problems with joints pain and bursitis. She has noticed DOE. She has occasional rapid beats followed by slow HR, not associated with dizziness or syncope. No LE edema, orthopnea or PND.  Her father had several MIs, the first at age 39. Brother underwent RFA for a-fib. 1 month follow up, she denies chest pain, she is tearful about her daughter in law dealing with terminal cancer and happy about expecting the first grandchild. She had a normal stress test and normal LVEF, mild LVH and DD 1 with elevated filling pressures, started on lasix 20 mg po daily.  05/30/2015 - patient is coming after 6 months, she remains very active she walks several times a week for 30 minutes without any shortness of breath or chest pain. No palpitations or syncope she is morning as her daughter-in-law recently passed away from breast cancer. She saw a new rheumatologist prescribed methotrexate in addition to likely no but she didn't tolerate it.  Past Medical History  Diagnosis Date  . Hypertension   . Dry eye syndrome 2006    plugs inserted and tear ducts  (hecker)  . 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 Lumbar degenerative disc disease     facet DJD.Joya Salm)  . Hx of gastroesophageal reflux (GERD)     hiatal hernia   . Moderate obstructive sleep apnea     CPAP  . Bouchard nodes (DJD hand)   . Hyperlipidemia   . Pancolonic diverticulosis     several episodes  . Diverticulitis   . Adenomatous colon polyp 2002  . Chest pain 2002    negative cardiolite  . Hx of peptic ulcer   . Seasonal allergic rhinitis   . 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 Total abdominal hysterectomy      without oophorectomy, fibroids and endometriosis age 43  . Dilation and curettage of uterus      prior to hysterectomy  . Cholecystectomy    . Carpal tunnel release Right     carpal tunnel repair  . Cystocele repair    . Breast reduction surgery  12/2002    Holernes  . Pre melanoma Left 07/2003    left back   . Colonoscopy      with polyp resection     Current Outpatient Prescriptions  Medication Sig Dispense Refill  . amLODipine (NORVASC) 5 MG tablet Take 5 mg by mouth daily.    . cholecalciferol (VITAMIN D) 1000 UNITS tablet Take 1,000 Units by mouth daily.    Marland Kitchen  cycloSPORINE (RESTASIS) 0.05 % ophthalmic emulsion Place 1 drop into both eyes 2 (two) times daily.    . furosemide (LASIX) 20 MG tablet Take 1 tablet (20 mg total) by mouth daily. 90 tablet 3  . hydroxychloroquine (PLAQUENIL) 200 MG tablet Take by mouth 2 (two) times daily.    . Ibuprofen (ADVIL) 200 MG CAPS Take 200 mg by mouth every 6 (six) hours as needed (pain).     . Multiple Vitamin (MULTIVITAMIN) tablet Take 1 tablet by mouth daily.    Marland Kitchen NITROSTAT 0.4 MG SL tablet Place 0.4 mg under the tongue every 5 (five) minutes as needed for chest pain.      No current facility-administered medications for this visit.    Allergies:   Diuretic; Hctz; Lisinopril; and Diovan    Social History:  The patient  reports that she has never  smoked. She does not have any smokeless tobacco history on file. She reports that she does not drink alcohol or use illicit drugs.   Family History:  The patient's family history includes Aneurysm in her mother and mother; CAD in her father; CVA in her mother; Colon polyps in her father; Diabetes in her father; Diabetes Mellitus I in her brother; Fibromyalgia in her sister; Heart attack in her father; Hypertension in her brother, brother, and father; Kidney failure in her father; Lupus in her sister; Melanoma in her brother, brother, and sister; Pancreatitis in her mother; Prostate cancer in her brother; Sleep apnea in her brother and sister; Stroke in her father and mother.    ROS:  Please see the history of present illness.   Otherwise, review of systems are positive for none.   All other systems are reviewed and negative.   PHYSICAL EXAM: VS:  BP 112/80 mmHg  Pulse 60  Ht 5' 3.5" (1.613 m)  Wt 186 lb 6.4 oz (84.55 kg)  BMI 32.50 kg/m2 , BMI Body mass index is 32.5 kg/(m^2). GEN: Well nourished, well developed, in no acute distress HEENT: normal Neck: no JVD, carotid bruits, or masses Cardiac: RRR; no murmurs, rubs, or gallops,no edema  Respiratory:  clear to auscultation bilaterally, normal work of breathing GI: soft, nontender, nondistended, + BS MS: no deformity or atrophy Skin: warm and dry, no rash Neuro:  Strength and sensation are intact Psych: euthymic mood, full affect  EKG:  EKG is ordered today. The ekg ordered today demonstrates SB, low voltage ECG.   Recent Labs: 12/01/2014: BUN 21; Creat 0.75; Potassium 3.8; Sodium 139   Lipid Panel    Component Value Date/Time   CHOL 179 12/01/2014 1056   TRIG 86 12/01/2014 1056   HDL 73 12/01/2014 1056   CHOLHDL 2.5 12/01/2014 1056   VLDL 17 12/01/2014 1056   LDLCALC 89 12/01/2014 1056     Wt Readings from Last 3 Encounters:  05/30/15 186 lb 6.4 oz (84.55 kg)  12/01/14 181 lb (82.101 kg)  11/22/14 181 lb (82.101 kg)       ASSESSMENT AND PLAN:  1.  Chest pain, DOE - risk factors include her age, FH of premature CAD, HTN, HLP and SLE. A Lexiscan nuclear stress test was normal. We'll continue the same management. No further workup needed at this time she is asymptomatic.  2. DOE - low voltage ECG, echo normal LVEF and grade 1 DD with elevated filling pressures, she was started on lasix 20 mg po daily. He LE edema has improved. She appears euvolemic.  3. Lipids - all at goal, no therapy  needed.  Follow up in 6 months.  Signed, Dorothy Spark, MD  05/30/2015 9:33 AM    [address], [place], [place]  [address] Phone: [phone]; Fax: [phone]

## 2015-12-01 ENCOUNTER — Ambulatory Visit (INDEPENDENT_AMBULATORY_CARE_PROVIDER_SITE_OTHER): Payer: Medicare Other | Admitting: Cardiology

## 2015-12-01 ENCOUNTER — Encounter (INDEPENDENT_AMBULATORY_CARE_PROVIDER_SITE_OTHER): Payer: Self-pay

## 2015-12-01 VITALS — BP 124/64 | HR 66 | Ht 63.5 in | Wt 183.0 lb

## 2015-12-01 DIAGNOSIS — R072 Precordial pain: Secondary | ICD-10-CM

## 2015-12-01 DIAGNOSIS — M329 Systemic lupus erythematosus, unspecified: Secondary | ICD-10-CM

## 2015-12-01 DIAGNOSIS — E7800 Pure hypercholesterolemia, unspecified: Secondary | ICD-10-CM | POA: Diagnosis not present

## 2015-12-01 DIAGNOSIS — I10 Essential (primary) hypertension: Secondary | ICD-10-CM

## 2015-12-01 DIAGNOSIS — I5032 Chronic diastolic (congestive) heart failure: Secondary | ICD-10-CM

## 2015-12-01 DIAGNOSIS — M3500 Sicca syndrome, unspecified: Secondary | ICD-10-CM

## 2015-12-01 MED ORDER — FUROSEMIDE 20 MG PO TABS: 20.0000 mg | ORAL_TABLET | Freq: Every day | ORAL | 3 refills | Status: AC

## 2015-12-01 NOTE — Patient Instructions (Signed)

## 2015-12-01 NOTE — Progress Notes (Signed)
Patient ID: Kerry Foster, female   DOB: September 12, 1940, 75 y.o.   MRN: [id number]      Cardiology Office Note  Date:  12/01/2015   ID:  Kerry Foster, Kerry Foster 22-Nov-1940, MRN [id number]  PCP:  Irven Shelling, MD  Cardiologist: Ena Dawley, MD   History of Present Illness: Kerry Foster is a 75 y.o. female with h/o borderline elevated lipids, treated hypertension, who presents for evaluation of exertional jaw pain, that occurs on minimal exertion like walking in a grocery store or cleaning dishes. She has h/o SLE and Sjogren disease on Plaquenil. She was exercising in the past but not lately as she was having problems with joints pain and bursitis. She has noticed DOE. She has occasional rapid beats followed by slow HR, not associated with dizziness or syncope. No LE edema, orthopnea or PND.  Her father had several MIs, the first at age 61. Brother underwent RFA for a-fib. 1 month follow up, she denies chest pain, she is tearful about her daughter in law dealing with terminal cancer and happy about expecting the first grandchild. She had a normal stress test and normal LVEF, mild LVH and DD 1 with elevated filling pressures, started on lasix 20 mg po daily.  05/30/2015 - patient is coming after 6 months, she remains very active she walks several times a week for 30 minutes without any shortness of breath or chest pain. No palpitations or syncope she is morning as her daughter-in-law recently passed away from breast cancer. She saw a new rheumatologist prescribed methotrexate in addition to likely no but she didn't tolerate it.  12/01/2015 - the patient has been doing well from cardiac standpoint and denies any chest pain or shortness of breath, her major problem is multiple joint pain there limiting her in her physical activities. She is scheduled to see her rheumatologist tomorrow. She is currently only on Plaquenil. She is having polyarthralgias in the small joints of her hands and also  the joints like knees and ankles. Denies any palpitations or syncope. No dyspnea on exertion she has been compliant with her meds and her blood pressure has been well controlled. She has mild edema around her ankles.   Past Medical History:  Diagnosis Date  . Adenomatous colon polyp 2002  . Bouchard nodes (DJD hand)   . Chest pain 2002   negative cardiolite  . Diverticulitis   . Dry eye syndrome 2006   plugs inserted and tear ducts  (hecker)  . Hx of gastroesophageal reflux (GERD)    hiatal hernia  . Hx of peptic ulcer   . Hyperlipidemia   . Hypertension   . 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 Lumbar degenerative disc disease    facet DJD.Joya Salm)  . Moderate obstructive sleep apnea    CPAP  . Pancolonic diverticulosis    several episodes  . Seasonal allergic rhinitis   . 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 BREAST REDUCTION SURGERY  12/2002   Holernes  . CARPAL TUNNEL RELEASE Right    carpal tunnel repair  . CHOLECYSTECTOMY    . COLONOSCOPY     with polyp resection  . CYSTOCELE REPAIR    . DILATION AND CURETTAGE OF UTERUS  prior to hysterectomy  . pre melanoma Left 07/2003   left back   . TOTAL ABDOMINAL HYSTERECTOMY     without oophorectomy, fibroids and endometriosis age 29   Current Outpatient Prescriptions  Medication Sig Dispense Refill  . amLODipine (NORVASC) 5 MG tablet Take 5 mg by mouth daily.    . cholecalciferol (VITAMIN D) 1000 UNITS tablet Take 1,000 Units by mouth daily.    . furosemide (LASIX) 20 MG tablet Take 1 tablet (20 mg total) by mouth daily. 90 tablet 3  . hydroxychloroquine (PLAQUENIL) 200 MG tablet Take by mouth 2 (two) times daily.    . Ibuprofen (ADVIL) 200 MG CAPS  Take 200 mg by mouth every 6 (six) hours as needed (pain).     . Multiple Vitamin (MULTIVITAMIN) tablet Take 1 tablet by mouth daily.    Marland Kitchen NITROSTAT 0.4 MG SL tablet Place 0.4 mg under the tongue every 5 (five) minutes as needed for chest pain.      No current facility-administered medications for this visit.     Allergies:   Diuretic [buchu-cornsilk-ch grass-hydran]; Hctz [hydrochlorothiazide]; Lisinopril; and Diovan [valsartan]    Social History:  The patient  reports that she has never smoked. She does not have any smokeless tobacco history on file. She reports that she does not drink alcohol or use drugs.   Family History:  The patient's family history includes Aneurysm in her mother and mother; CAD in her father; CVA in her mother; Colon polyps in her father; Diabetes in her father; Diabetes Mellitus I in her brother; Fibromyalgia in her sister; Heart attack in her father; Hypertension in her brother, brother, and father; Kidney failure in her father; Lupus in her sister; Melanoma in her brother, brother, and sister; Pancreatitis in her mother; Prostate cancer in her brother; Sleep apnea in her brother and sister; Stroke in her father and mother.    ROS:  Please see the history of present illness.   Otherwise, review of systems are positive for none.   All other systems are reviewed and negative.   PHYSICAL EXAM: VS:  BP 124/64   Pulse 66   Ht 5' 3.5" (1.613 m)   Wt 183 lb (83 kg)   BMI 31.91 kg/m  , BMI Body mass index is 31.91 kg/m. GEN: Well nourished, well developed, in no acute distress HEENT: normal Neck: no JVD, carotid bruits, or masses Cardiac: RRR; no murmurs, rubs, or gallops mild edema around her ankles.   Respiratory:  clear to auscultation bilaterally, normal work of breathing GI: soft, nontender, nondistended, + BS MS: no deformity or atrophy Skin: warm and dry, no rash Neuro:  Strength and sensation are intact Psych: euthymic mood, full affect  EKG:  EKG is  ordered today. The ekg ordered today demonstratesalmost sinus rhythm, 66 bpm normal EKG.   Recent Labs: No results found for requested labs within last 8760 hours.   Lipid Panel    Component Value Date/Time   CHOL 179 12/01/2014 1056   TRIG 86 12/01/2014 1056   HDL 73 12/01/2014 1056   CHOLHDL 2.5 12/01/2014 1056   VLDL 17 12/01/2014 1056   LDLCALC 89 12/01/2014 1056     Wt Readings from Last 3 Encounters:  12/01/15 183 lb (83 kg)  05/30/15 186 lb 6.4 oz (84.6 kg)  12/01/14 181 lb (82.1 kg)      ASSESSMENT AND PLAN:  1.  Chest pain, DOE - risk factors include her age, FH of premature CAD, HTN, HLP and  SLE. A Lexiscan nuclear stress test was normal. We'll continue the same management. No further workup needed at this time she is asymptomatic.She is encouraged to continue exercising. She can follow-up every year to 2 from now on.  2.  chronic diastolic CHF  - echocardiogram showed preserved LVEF and elevated filling pressures, she was started on lasix 20 mg po daily. Her LE edema has improved.She now has minimal swelling around her ankles that appears to be related to inflammation in her joints rather than CHF. She is advised to take an extra Lasix 20 mg in the afternoons when her swelling gets worse.   3. Lipids - all at goal, no therapy needed.  Follow up in  1-2 years.   Signed, Ena Dawley, MD  12/01/2015 3:56 PM    [facility] [address] [address], [place], [place]  [address] Phone: [phone]; Fax: [phone]

## 2016-01-04 ENCOUNTER — Other Ambulatory Visit: Payer: Self-pay | Admitting: Internal Medicine

## 2016-01-04 DIAGNOSIS — Z1231 Encounter for screening mammogram for malignant neoplasm of breast: Secondary | ICD-10-CM

## 2016-02-07 ENCOUNTER — Ambulatory Visit
Admission: RE | Admit: 2016-02-07 | Discharge: 2016-02-07 | Disposition: A | Discharge status: Home / Self Care | Payer: Medicare Other | Source: Ambulatory Visit | Attending: Internal Medicine | Admitting: Internal Medicine

## 2016-02-07 DIAGNOSIS — Z1231 Encounter for screening mammogram for malignant neoplasm of breast: Secondary | ICD-10-CM

## 2017-02-12 ENCOUNTER — Other Ambulatory Visit: Payer: Self-pay | Admitting: Internal Medicine

## 2017-02-12 DIAGNOSIS — Z139 Encounter for screening, unspecified: Secondary | ICD-10-CM

## 2017-03-06 ENCOUNTER — Ambulatory Visit
Admission: RE | Admit: 2017-03-06 | Discharge: 2017-03-06 | Disposition: A | Discharge status: Home / Self Care | Payer: Medicare Other | Source: Ambulatory Visit | Attending: Internal Medicine | Admitting: Internal Medicine

## 2017-03-06 DIAGNOSIS — Z139 Encounter for screening, unspecified: Secondary | ICD-10-CM

## 2017-07-29 ENCOUNTER — Other Ambulatory Visit: Payer: Self-pay | Admitting: Internal Medicine

## 2017-07-29 DIAGNOSIS — R109 Unspecified abdominal pain: Secondary | ICD-10-CM

## 2017-07-30 ENCOUNTER — Ambulatory Visit
Admission: RE | Admit: 2017-07-30 | Discharge: 2017-07-30 | Disposition: A | Discharge status: Home / Self Care | Payer: Medicare Other | Source: Ambulatory Visit | Attending: Internal Medicine | Admitting: Internal Medicine

## 2017-07-30 DIAGNOSIS — R109 Unspecified abdominal pain: Secondary | ICD-10-CM

## 2017-07-30 MED ORDER — IOHEXOL 300 MG/ML  SOLN
100.0000 mL | Freq: Once | INTRAMUSCULAR | Status: AC | PRN
  Administered 2017-07-30: 100 mL via INTRAVENOUS

## 2018-07-23 ENCOUNTER — Other Ambulatory Visit: Payer: Self-pay | Admitting: Internal Medicine

## 2018-07-23 DIAGNOSIS — Z1231 Encounter for screening mammogram for malignant neoplasm of breast: Secondary | ICD-10-CM

## 2018-09-02 ENCOUNTER — Ambulatory Visit
Admission: RE | Admit: 2018-09-02 | Discharge: 2018-09-02 | Disposition: A | Discharge status: Home / Self Care | Payer: Medicare Other | Source: Ambulatory Visit | Attending: Internal Medicine | Admitting: Internal Medicine

## 2018-09-02 ENCOUNTER — Other Ambulatory Visit: Payer: Self-pay

## 2018-09-02 DIAGNOSIS — Z1231 Encounter for screening mammogram for malignant neoplasm of breast: Secondary | ICD-10-CM

## 2019-08-10 ENCOUNTER — Other Ambulatory Visit: Payer: Self-pay | Admitting: Internal Medicine

## 2019-08-10 DIAGNOSIS — Z1231 Encounter for screening mammogram for malignant neoplasm of breast: Secondary | ICD-10-CM

## 2019-09-04 ENCOUNTER — Ambulatory Visit
Admission: RE | Admit: 2019-09-04 | Discharge: 2019-09-04 | Disposition: A | Discharge status: Home / Self Care | Payer: Medicare Other | Source: Ambulatory Visit | Attending: Internal Medicine | Admitting: Internal Medicine

## 2019-09-04 ENCOUNTER — Other Ambulatory Visit: Payer: Self-pay

## 2019-09-04 DIAGNOSIS — Z1231 Encounter for screening mammogram for malignant neoplasm of breast: Secondary | ICD-10-CM

## 2019-09-22 ENCOUNTER — Other Ambulatory Visit: Payer: Self-pay | Admitting: Neurosurgery

## 2019-09-22 DIAGNOSIS — M5416 Radiculopathy, lumbar region: Secondary | ICD-10-CM

## 2019-10-03 ENCOUNTER — Ambulatory Visit
Admission: RE | Admit: 2019-10-03 | Discharge: 2019-10-03 | Disposition: A | Discharge status: Home / Self Care | Payer: Medicare Other | Source: Ambulatory Visit | Attending: Neurosurgery | Admitting: Neurosurgery

## 2019-10-03 DIAGNOSIS — M5416 Radiculopathy, lumbar region: Secondary | ICD-10-CM

## 2020-08-03 ENCOUNTER — Other Ambulatory Visit: Payer: Self-pay | Admitting: Internal Medicine

## 2020-08-03 DIAGNOSIS — M8580 Other specified disorders of bone density and structure, unspecified site: Secondary | ICD-10-CM

## 2020-09-01 ENCOUNTER — Other Ambulatory Visit: Payer: Self-pay | Admitting: Internal Medicine

## 2020-09-01 DIAGNOSIS — Z1231 Encounter for screening mammogram for malignant neoplasm of breast: Secondary | ICD-10-CM

## 2020-10-21 ENCOUNTER — Ambulatory Visit: Payer: Medicare Other

## 2020-11-25 ENCOUNTER — Ambulatory Visit: Payer: Medicare Other

## 2020-11-25 ENCOUNTER — Ambulatory Visit
Admission: RE | Admit: 2020-11-25 | Discharge: 2020-11-25 | Disposition: A | Discharge status: Home / Self Care | Payer: Medicare Other | Source: Ambulatory Visit | Attending: Internal Medicine | Admitting: Internal Medicine

## 2020-11-25 ENCOUNTER — Other Ambulatory Visit: Payer: Self-pay

## 2020-11-25 DIAGNOSIS — Z1231 Encounter for screening mammogram for malignant neoplasm of breast: Secondary | ICD-10-CM

## 2021-01-18 ENCOUNTER — Ambulatory Visit
Admission: RE | Admit: 2021-01-18 | Discharge: 2021-01-18 | Disposition: A | Discharge status: Home / Self Care | Payer: Medicare Other | Source: Ambulatory Visit | Attending: Internal Medicine | Admitting: Internal Medicine

## 2021-01-18 DIAGNOSIS — M8580 Other specified disorders of bone density and structure, unspecified site: Secondary | ICD-10-CM

## 2021-08-03 ENCOUNTER — Other Ambulatory Visit: Payer: Self-pay | Admitting: Internal Medicine

## 2021-08-03 ENCOUNTER — Ambulatory Visit
Admission: RE | Admit: 2021-08-03 | Discharge: 2021-08-03 | Disposition: A | Discharge status: Home / Self Care | Payer: Medicare Other | Source: Ambulatory Visit | Attending: Internal Medicine | Admitting: Internal Medicine

## 2021-08-03 DIAGNOSIS — M25551 Pain in right hip: Secondary | ICD-10-CM

## 2021-11-06 ENCOUNTER — Other Ambulatory Visit: Payer: Self-pay | Admitting: Internal Medicine

## 2021-11-06 DIAGNOSIS — Z1231 Encounter for screening mammogram for malignant neoplasm of breast: Secondary | ICD-10-CM

## 2021-11-26 IMAGING — MG MM DIGITAL SCREENING BILAT W/ TOMO AND CAD
8 series · 8 of 24 positions shown · non-contrast
Comparison: Previous exam(s).

CLINICAL DATA: Screening.

EXAM:
DIGITAL SCREENING BILATERAL MAMMOGRAM WITH TOMOSYNTHESIS AND CAD
TECHNIQUE: Bilateral screening digital craniocaudal and mediolateral oblique
mammograms were obtained. Bilateral screening digital breast
tomosynthesis was performed. The images were evaluated with
computer-aided detection.

[R CC synth-2D]
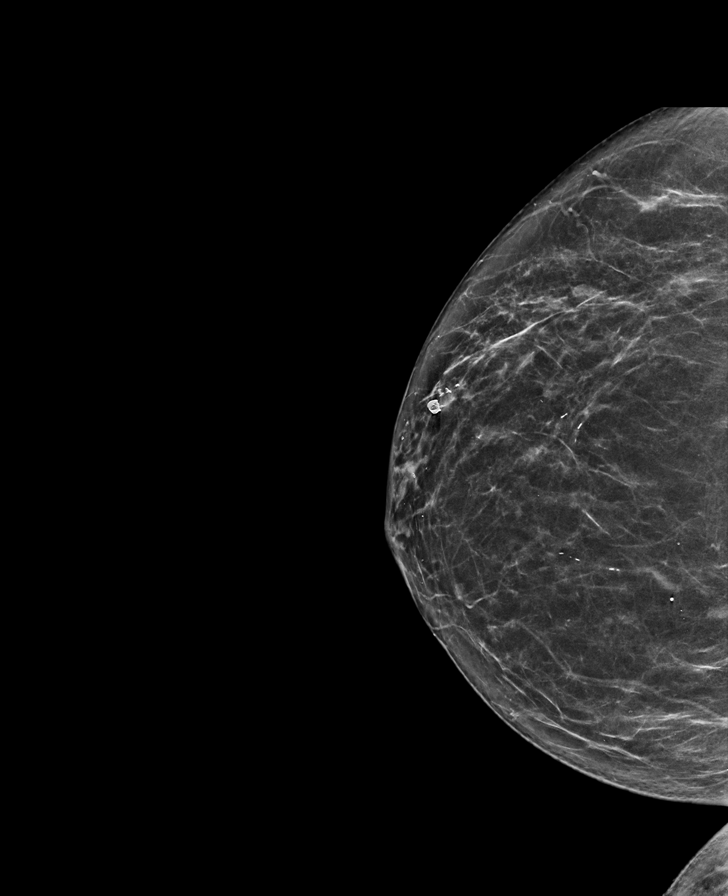

[L MLO synth-2D]
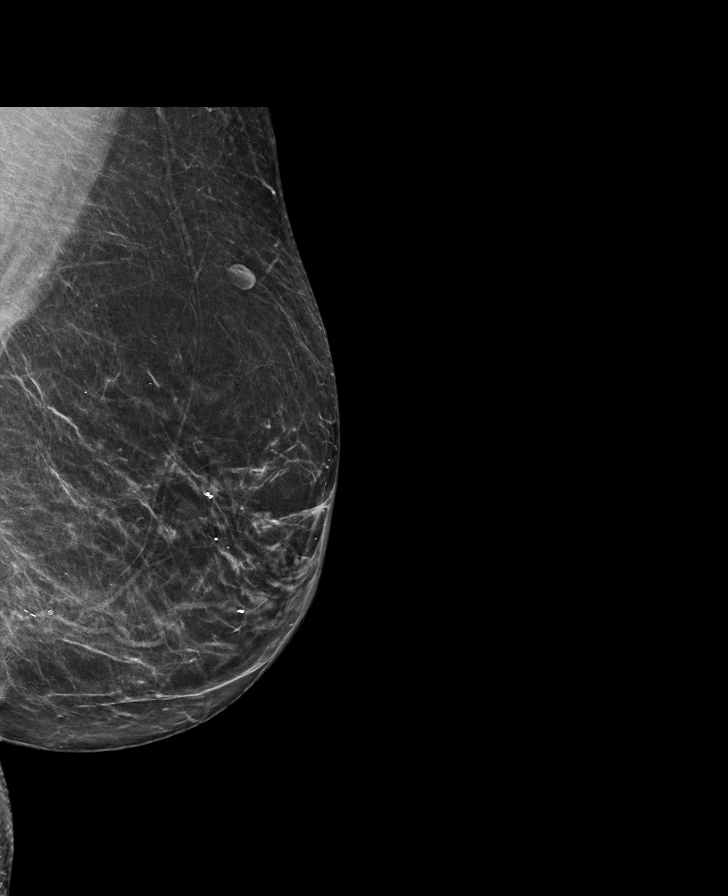

[L CC synth-2D]
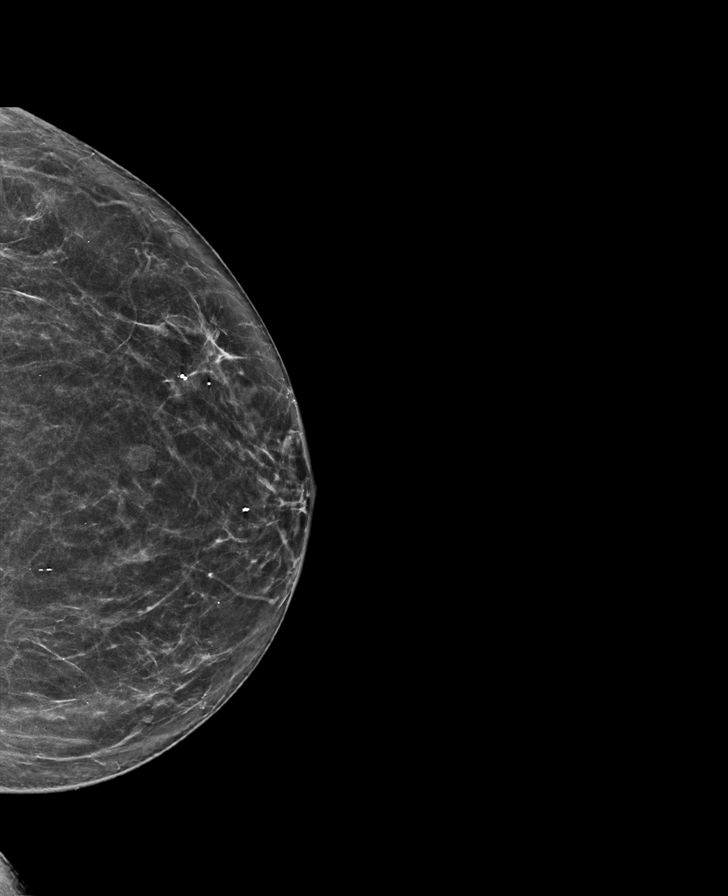

[R MLO synth-2D]
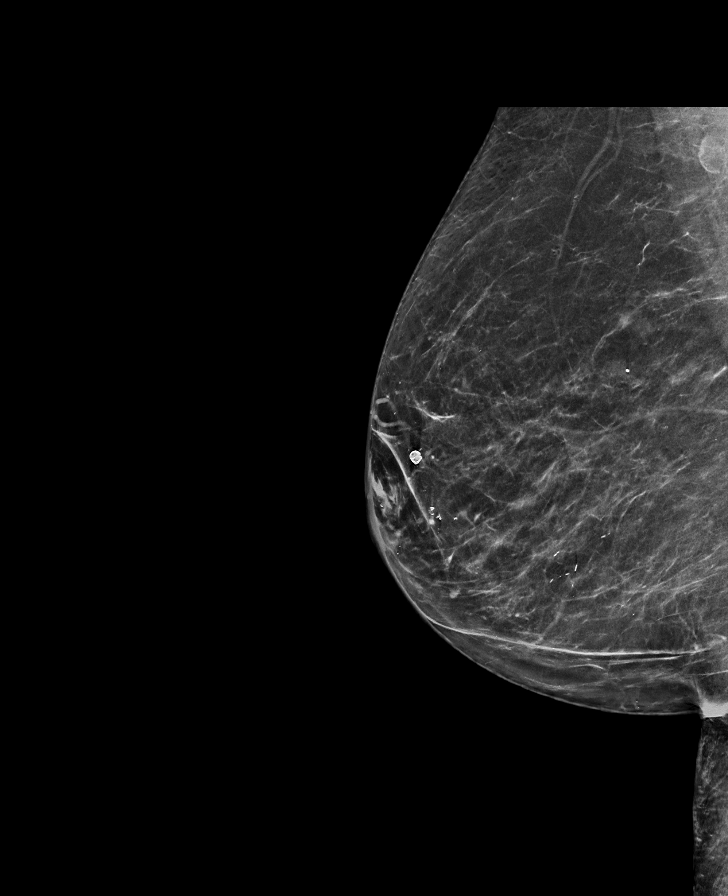

[L CC tomo · tomo slice 35/68.0]
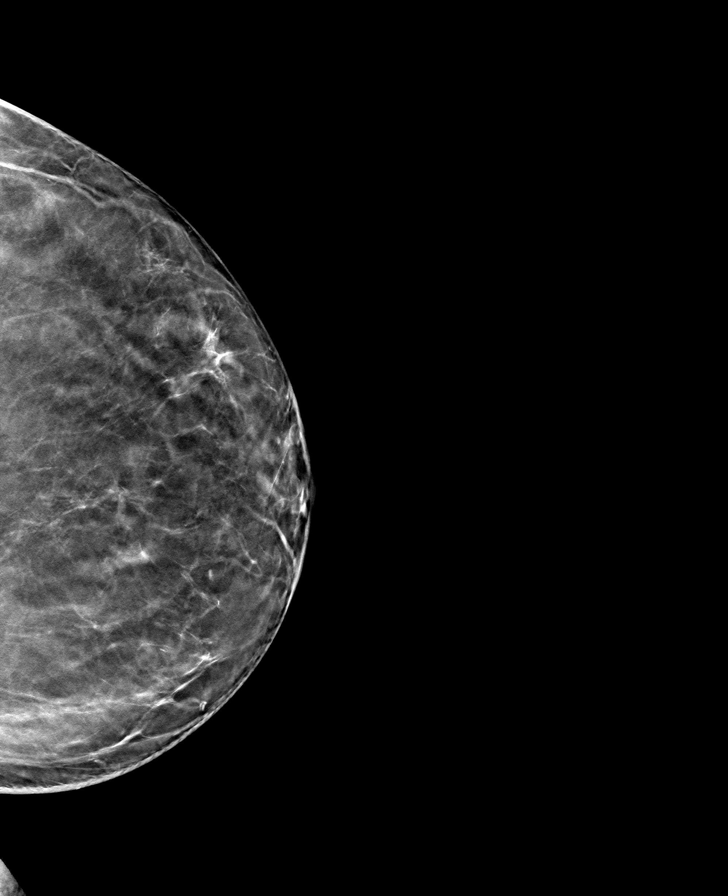

[R CC tomo · tomo slice 34/67.0]
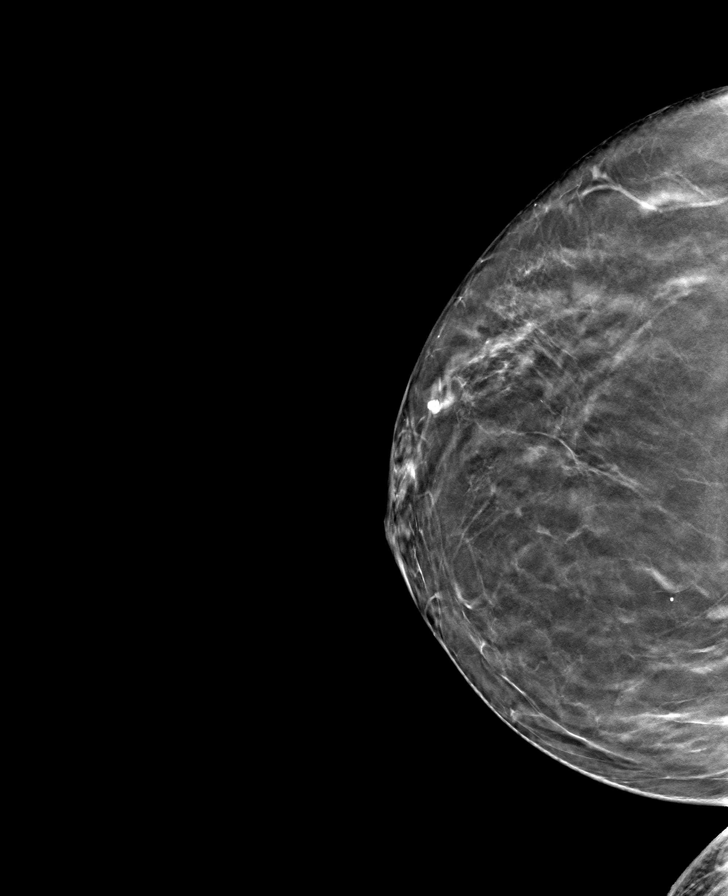

[R MLO tomo · tomo slice 37/72.0]
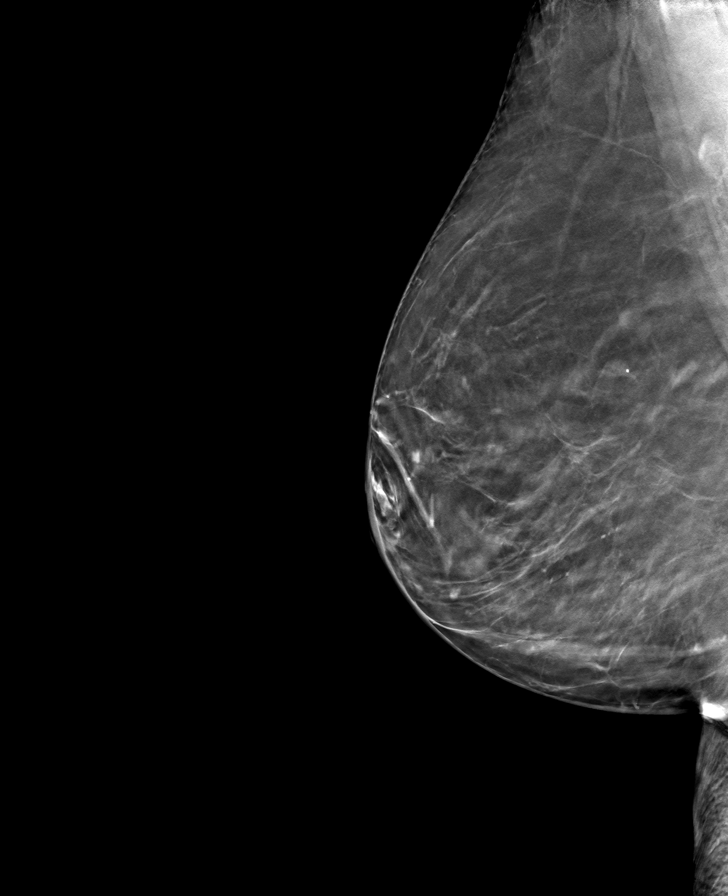

[L MLO tomo · tomo slice 38/75.0]
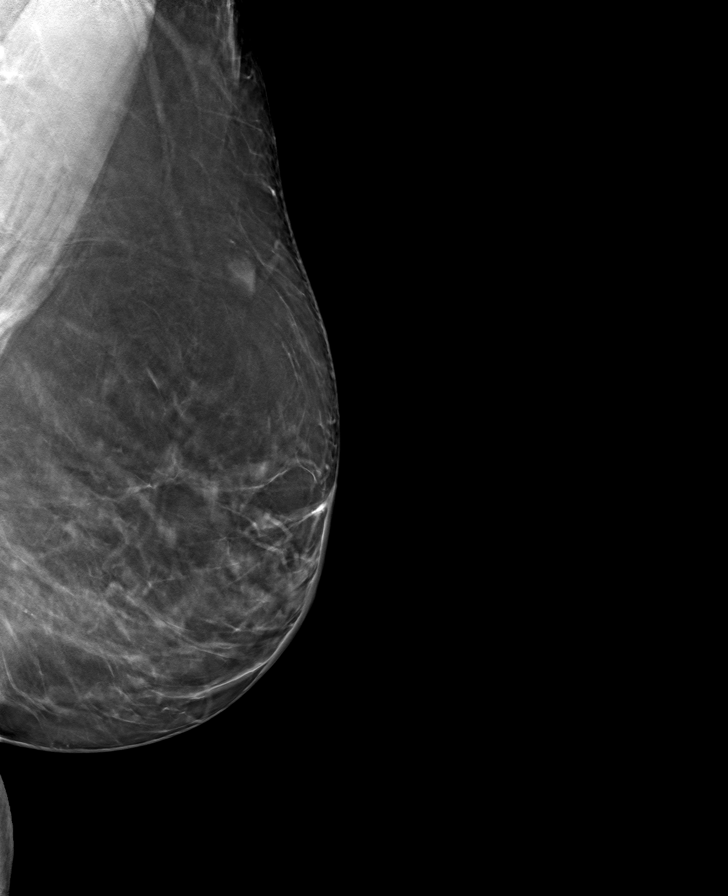

[8 of 24 positions shown; findings below may reference images not displayed]

ACR Breast Density Category b: There are scattered areas of
fibroglandular density.
FINDINGS: There are no findings suspicious for malignancy.
IMPRESSION: No mammographic evidence of malignancy. A result letter of this
screening mammogram will be mailed directly to the patient.

RECOMMENDATION:
Screening mammogram in one year. (Code:51-O-LD2)

BI-RADS CATEGORY  1: Negative.

## 2021-12-20 ENCOUNTER — Ambulatory Visit
Admission: RE | Admit: 2021-12-20 | Discharge: 2021-12-20 | Disposition: A | Discharge status: Home / Self Care | Payer: Medicare Other | Source: Ambulatory Visit | Attending: Internal Medicine | Admitting: Internal Medicine

## 2021-12-20 DIAGNOSIS — Z1231 Encounter for screening mammogram for malignant neoplasm of breast: Secondary | ICD-10-CM

## 2021-12-22 ENCOUNTER — Other Ambulatory Visit: Payer: Self-pay | Admitting: Internal Medicine

## 2021-12-22 DIAGNOSIS — R928 Other abnormal and inconclusive findings on diagnostic imaging of breast: Secondary | ICD-10-CM

## 2022-01-01 ENCOUNTER — Ambulatory Visit: Admission: RE | Admit: 2022-01-01 | Payer: Medicare Other | Source: Ambulatory Visit

## 2022-01-01 ENCOUNTER — Ambulatory Visit
Admission: RE | Admit: 2022-01-01 | Discharge: 2022-01-01 | Disposition: A | Discharge status: Home / Self Care | Payer: Medicare Other | Source: Ambulatory Visit | Attending: Internal Medicine | Admitting: Internal Medicine

## 2022-01-01 DIAGNOSIS — R928 Other abnormal and inconclusive findings on diagnostic imaging of breast: Secondary | ICD-10-CM

## 2022-08-06 ENCOUNTER — Other Ambulatory Visit: Payer: Self-pay | Admitting: Internal Medicine

## 2022-08-06 DIAGNOSIS — M8588 Other specified disorders of bone density and structure, other site: Secondary | ICD-10-CM

## 2022-08-31 ENCOUNTER — Emergency Department (HOSPITAL_BASED_OUTPATIENT_CLINIC_OR_DEPARTMENT_OTHER)
Admission: EM | Admit: 2022-08-31 | Discharge: 2022-08-31 | Disposition: A | Discharge status: Home / Self Care | Payer: Medicare Other | Attending: Emergency Medicine | Admitting: Emergency Medicine

## 2022-08-31 ENCOUNTER — Other Ambulatory Visit: Payer: Self-pay

## 2022-08-31 ENCOUNTER — Encounter (HOSPITAL_BASED_OUTPATIENT_CLINIC_OR_DEPARTMENT_OTHER): Payer: Self-pay | Admitting: Emergency Medicine

## 2022-08-31 ENCOUNTER — Emergency Department (HOSPITAL_BASED_OUTPATIENT_CLINIC_OR_DEPARTMENT_OTHER): Payer: Medicare Other

## 2022-08-31 DIAGNOSIS — Y92481 Parking lot as the place of occurrence of the external cause: Secondary | ICD-10-CM | POA: Insufficient documentation

## 2022-08-31 DIAGNOSIS — Y9301 Activity, walking, marching and hiking: Secondary | ICD-10-CM | POA: Insufficient documentation

## 2022-08-31 DIAGNOSIS — S0990XA Unspecified injury of head, initial encounter: Secondary | ICD-10-CM | POA: Diagnosis present

## 2022-08-31 DIAGNOSIS — Z79899 Other long term (current) drug therapy: Secondary | ICD-10-CM | POA: Diagnosis not present

## 2022-08-31 DIAGNOSIS — M542 Cervicalgia: Secondary | ICD-10-CM | POA: Diagnosis not present

## 2022-08-31 DIAGNOSIS — W01198A Fall on same level from slipping, tripping and stumbling with subsequent striking against other object, initial encounter: Secondary | ICD-10-CM | POA: Insufficient documentation

## 2022-08-31 DIAGNOSIS — I10 Essential (primary) hypertension: Secondary | ICD-10-CM | POA: Diagnosis not present

## 2022-08-31 DIAGNOSIS — S0003XA Contusion of scalp, initial encounter: Secondary | ICD-10-CM | POA: Insufficient documentation

## 2022-08-31 MED ORDER — ACETAMINOPHEN 325 MG PO TABS
650.0000 mg | ORAL_TABLET | Freq: Once | ORAL | Status: AC
  Administered 2022-08-31: 650 mg via ORAL
  Filled 2022-08-31: qty 2

## 2022-08-31 NOTE — Discharge Instructions (Addendum)
Evaluation today was overall reassuring.  CT scans were reassuring.  Recommend you follow-up with your PCP.  If you have new visual disturbance, slurred speech, facial droop, weakness or numbness in your extremities, or any other concerning symptoms please return emergency department further evaluation.

## 2022-08-31 NOTE — ED Provider Notes (Signed)
[facility] Provider Note   CSN: [id number] Arrival date & time: 08/31/22  1952     History  Chief Complaint  Patient presents with   Head Injury   HPI Kerry Foster is a 82 y.o. female with hypertension, hyperlipidemia, lupus, and Sjogren syndrome presenting for head injury.  Occurred about 2 hours ago.  Patient was walking through the parking lot tripped over the curb and fell backwards hitting the back of her head and neck.  Denies any other injuries.  Able to ambulate shortly after fall.  States she sustained an abrasion to the back of her head.  Does endorse a headache.  No visual disturbance.  Also endorses upper midline cervical tenderness.  Denies numbness or weakness in her extremities.  Denies use of a blood thinner.  Denies chest pain or shortness of breath.  Denies syncope or near syncope.   Head Injury      Home Medications Prior to Admission medications   Medication Sig Start Date End Date Taking? Authorizing Provider  amLODipine (NORVASC) 5 MG tablet Take 5 mg by mouth daily.    [provider]  cholecalciferol (VITAMIN D) 1000 UNITS tablet Take 1,000 Units by mouth daily.    [provider]  furosemide (LASIX) 20 MG tablet Take 1 tablet (20 mg total) by mouth daily. 12/01/15   Lars Masson, MD  hydroxychloroquine (PLAQUENIL) 200 MG tablet Take by mouth 2 (two) times daily.    [provider]  Ibuprofen (ADVIL) 200 MG CAPS Take 200 mg by mouth every 6 (six) hours as needed (pain).     [provider]  Multiple Vitamin (MULTIVITAMIN) tablet Take 1 tablet by mouth daily.    [provider]  NITROSTAT 0.4 MG SL tablet Place 0.4 mg under the tongue every 5 (five) minutes as needed for chest pain.  10/18/14   [provider]      Allergies    Diuretic [buchu-cornsilk-ch grass-hydran], Hctz [hydrochlorothiazide], Lisinopril, and Diovan [valsartan]    Review of  Systems   See HPI for pertinent positives   Physical Exam   Vitals:   08/31/22 1958  BP: (!) 202/73  Pulse: 62  Resp: 16  Temp: 97.7 F (36.5 C)  SpO2: 100%    CONSTITUTIONAL:  well-appearing, NAD NEURO:  GCS 15. Speech is goal oriented. No deficits appreciated to CN III-XII; symmetric eyebrow raise, no facial drooping, tongue midline. Patient has equal grip strength bilaterally with 5/5 strength against resistance in all major muscle groups bilaterally. Sensation to light touch intact. Patient moves extremities without ataxia. Normal finger-nose-finger. Patient ambulatory with steady gait. Head: Quarter sized superficial abrasion located at the posterior aspect of the head near the crown.  Not actively bleeding.  No raccoon eyes, Battle sign no rhinorrhea EYES:  eyes equal and reactive ENT/NECK:  Supple, no stridor  CARDIO: Regular rate and rhythm, appears well-perfused  PULM:  No respiratory distress, CTAB GI/GU:  non-distended, soft MSK/SPINE:  No gross deformities, no edema, moves all extremities  SKIN:  no rash, atraumatic  *Additional and/or pertinent findings included in MDM below  ED Results / Procedures / Treatments   Labs (all labs ordered are listed, but only abnormal results are displayed) Labs Reviewed - No data to display  EKG None  Radiology CT Cervical Spine Wo Contrast  Result Date: 08/31/2022 CLINICAL DATA:  Trauma/fall, posterior head injury, headache, neck pain EXAM: CT HEAD WITHOUT CONTRAST CT CERVICAL SPINE WITHOUT  CONTRAST TECHNIQUE: Multidetector CT imaging of the head and cervical spine was performed following the standard protocol without intravenous contrast. Multiplanar CT image reconstructions of the cervical spine were also generated. RADIATION DOSE REDUCTION: This exam was performed according to the departmental dose-optimization program which includes automated exposure control, adjustment of the mA and/or kV according to patient size and/or use  of iterative reconstruction technique. COMPARISON:  None Available. FINDINGS: CT HEAD FINDINGS Brain: No evidence of acute infarction, hemorrhage, hydrocephalus, extra-axial collection or mass lesion/mass effect. Vascular: No hyperdense vessel or unexpected calcification. Skull: Normal. Negative for fracture or focal lesion. Sinuses/Orbits: The visualized paranasal sinuses are essentially clear. The mastoid air cells are unopacified. Other: Small extracranial hematoma along the left parietal vertex (series 2/image 24). CT CERVICAL SPINE FINDINGS Alignment: Normal cervical lordosis. Skull base and vertebrae: No acute fracture. No primary bone lesion or focal pathologic process. Soft tissues and spinal canal: No prevertebral fluid or swelling. No visible canal hematoma. Disc levels: Mild degenerative changes of the mid cervical spine. Spinal canal is patent. Upper chest: Visualized lung apices are clear. Other: Visualized thyroid is unremarkable. IMPRESSION: Small extracranial hematoma along the left parietal vertex. No evidence of calvarial fracture. No acute intracranial abnormality. No traumatic injury to the cervical spine. Mild degenerative changes. Electronically Signed   By: Charline Bills M.D.   On: 08/31/2022 21:12   CT Head Wo Contrast  Result Date: 08/31/2022 CLINICAL DATA:  Trauma/fall, posterior head injury, headache, neck pain EXAM: CT HEAD WITHOUT CONTRAST CT CERVICAL SPINE WITHOUT CONTRAST TECHNIQUE: Multidetector CT imaging of the head and cervical spine was performed following the standard protocol without intravenous contrast. Multiplanar CT image reconstructions of the cervical spine were also generated. RADIATION DOSE REDUCTION: This exam was performed according to the departmental dose-optimization program which includes automated exposure control, adjustment of the mA and/or kV according to patient size and/or use of iterative reconstruction technique. COMPARISON:  None Available.  FINDINGS: CT HEAD FINDINGS Brain: No evidence of acute infarction, hemorrhage, hydrocephalus, extra-axial collection or mass lesion/mass effect. Vascular: No hyperdense vessel or unexpected calcification. Skull: Normal. Negative for fracture or focal lesion. Sinuses/Orbits: The visualized paranasal sinuses are essentially clear. The mastoid air cells are unopacified. Other: Small extracranial hematoma along the left parietal vertex (series 2/image 24). CT CERVICAL SPINE FINDINGS Alignment: Normal cervical lordosis. Skull base and vertebrae: No acute fracture. No primary bone lesion or focal pathologic process. Soft tissues and spinal canal: No prevertebral fluid or swelling. No visible canal hematoma. Disc levels: Mild degenerative changes of the mid cervical spine. Spinal canal is patent. Upper chest: Visualized lung apices are clear. Other: Visualized thyroid is unremarkable. IMPRESSION: Small extracranial hematoma along the left parietal vertex. No evidence of calvarial fracture. No acute intracranial abnormality. No traumatic injury to the cervical spine. Mild degenerative changes. Electronically Signed   By: Charline Bills M.D.   On: 08/31/2022 21:12    Procedures Procedures    Medications Ordered in ED Medications  acetaminophen (TYLENOL) tablet 650 mg (650 mg Oral Given 08/31/22 2058)    ED Course/ Medical Decision Making/ A&P                                 Medical Decision Making Amount and/or Complexity of Data Reviewed Radiology: ordered.  Risk OTC drugs.   82 yo well appearing female presenting for evaluation status post mechanical fall.  Exam notable for superficial abrasion to the  back of the head.  DDx includes skull fracture, ICH, subdural or epidural hematoma cervical spine and cord injury.  CT scans were reassuring overall.  Did reveal a small extracranial hematoma but otherwise no acute findings.  Treated her pain with Tylenol and cleaned the wound. On serial reassessments,  she remained clinically well, hemodynamically stable and in no acute distress without focal neurodeficits. Advised her to continue taking her blood pressure medicine at home as her blood pressure here was elevated.  Discussed return precautions.  Advised to follow-up PCP.  Discharged home in good condition.        Final Clinical Impression(s) / ED Diagnoses Final diagnoses:  Scalp hematoma, initial encounter    Rx / DC Orders ED Discharge Orders     None         Gareth Eagle, PA-C 08/31/22 2149    Maia Plan, MD 09/04/22 [phone]

## 2022-08-31 NOTE — ED Notes (Signed)
Pt denied bedside commode, or wheelchair to bathroom

## 2022-08-31 NOTE — ED Triage Notes (Signed)
Patient here after she fell and hit the back of her head on concrete, with abrasion to back of her head, which is oozing.  She states she has a headache and neck pain.  She is not on a blood thinner.  She has full recall of the incident, no LOC.  GCS 15.

## 2022-10-31 ENCOUNTER — Ambulatory Visit: Payer: Medicare Other | Admitting: Obstetrics

## 2022-11-05 ENCOUNTER — Ambulatory Visit (INDEPENDENT_AMBULATORY_CARE_PROVIDER_SITE_OTHER): Payer: Medicare Other | Admitting: Obstetrics

## 2022-11-05 ENCOUNTER — Encounter: Payer: Self-pay | Admitting: Obstetrics

## 2022-11-05 VITALS — BP 135/76 | HR 53 | Ht 62.3 in | Wt 175.0 lb

## 2022-11-05 DIAGNOSIS — N8110 Cystocele, unspecified: Secondary | ICD-10-CM | POA: Diagnosis not present

## 2022-11-05 DIAGNOSIS — Z87440 Personal history of urinary (tract) infections: Secondary | ICD-10-CM

## 2022-11-05 DIAGNOSIS — Z9889 Other specified postprocedural states: Secondary | ICD-10-CM

## 2022-11-05 DIAGNOSIS — R350 Frequency of micturition: Secondary | ICD-10-CM | POA: Insufficient documentation

## 2022-11-05 DIAGNOSIS — R159 Full incontinence of feces: Secondary | ICD-10-CM

## 2022-11-05 DIAGNOSIS — N3946 Mixed incontinence: Secondary | ICD-10-CM | POA: Insufficient documentation

## 2022-11-05 DIAGNOSIS — R351 Nocturia: Secondary | ICD-10-CM | POA: Diagnosis not present

## 2022-11-05 NOTE — Assessment & Plan Note (Signed)
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 Patsi Sears - no mesh noted on exam, normal PVR - repeat PVR if clinical change

## 2022-11-05 NOTE — Progress Notes (Addendum)
New Patient Evaluation and Consultation  Referring Provider: Joya Martyr, MD PCP: Kerry Martyr, MD Date of Service: 11/05/2022  SUBJECTIVE Chief Complaint: New Patient (Initial Visit) Kerry Foster is a 82 y.o. female is here for complete prolapse.)  History of Present Illness: Kerry Foster is a 82 y.o. White or Caucasian female seen in consultation at the request of Kerry Foster for evaluation of pelvic organ prolapse and urinary incontinence.    Tried Kegels without relief, OAB medication Vibegron with relief, oral and vaginal estrogen by Kerry. Kerry Foster without relief, and anterior/posterior repair in the past. Reports relief with vibegron, but caused significant mouth and eye dryness due to h/o Sjogren's    History of cystocele and rectocele repair with "sling" with 2 suprapubic incisions by Kerry. Kerry Foster (OBGYN) and Kerry. Kerry Foster for vaginal bulge and postoperative urinary retention with catheterization for 1 week. Prior abdominal hysterectomy with bilateral salpningectomy at 82yo for AUB Prior evaluation by Kerry. Kerry Foster around 4-5 years ago for urinary leakage Reports history of bulging disc and pinched nerves L4-5, reports increased right leg weakness and history of hip injections 1 year ago  Review of records significant for: SLE, sjogren's syndrome on plaquenil, angina and DOE on lasix (last used 1 year ago)  Urinary Symptoms: Leaks urine with cough/ sneeze, lifting, going from sitting to standing, with a full bladder, with movement to the bathroom, with urgency, and while asleep started around 4-5 years ago Leaks 3-4 time(s) per days with urgency, 2 times/day with insensible leakage Pad use: 4 pads per day.   Patient is bothered by UI symptoms.  Day time voids 4-5.  Nocturia: 2 times per night to void with OSA and DOE, stopped using CPAP 4-5 years ago Voiding dysfunction:  empties bladder well.  Patient does not use a catheter to empty bladder.  When urinating,  patient feels dribbling after finishing and to push on her belly or vagina to empty bladder Drinks: <64oz per day, 1 cup of coffee  UTIs: 2 UTI's in the last year treated by rheumatologist with tenderness in bladder and malodorous urine Denies history of blood in urine, kidney or bladder stones, pyelonephritis, bladder cancer, and kidney cancer No results found for the last 90 days.  Pelvic Organ Prolapse Symptoms:                  Patient Admits to a feeling of a egg size bulge outside the vaginal opening. It has been present for 2 years and increased in size Patient Admits to seeing a bulge.  This bulge is bothersome.  Bowel Symptom: Bowel movements: 1 time(s) per day Stool consistency: soft  or loose Straining: yes.  Splinting: yes.  Incomplete evacuation: yes.  Patient Admits to accidental bowel leakage / fecal incontinence on incontinence pad for 4-5 months  Occurs: 2-3 time(s) per month  Consistency with leakage: soft  Bowel regimen: diet Last colonoscopy: Date in 2020, Results polyps per patient  Sexual Function Sexually active: no.  Pain with sex: has discomfort due to prolapse  Pelvic Pain Admits to pelvic pain Location: lower abdomen and vaginal area Pain occurs: after prolonged standing or sitting Prior pain treatment: none Improved by: laying down and lifting legs Worsened by: prolonged standing or sitting  Past Medical History:  Past Medical History:  Diagnosis Date   Adenomatous colon polyp 2002   Bouchard nodes (DJD hand)    Chest pain 2002   negative cardiolite   Diverticulitis    Dry eye  syndrome 2006   plugs inserted and tear ducts  (hecker)   Hx of gastroesophageal reflux (GERD)    hiatal hernia   Hx of peptic ulcer    Hyperlipidemia    Hypertension    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, right leg, initial encounter    Botero   Lumbar degenerative disc disease    facet DJD.(Botero)   Moderate obstructive sleep apnea    CPAP    Pancolonic diverticulosis    several episodes   Seasonal allergic rhinitis    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, ANA+, SSA+, SSB+, arthritis/arthralgias, fatigue, severe slcca-improved on PLQ, Hawkes ophtho hecker rheum [facility] E[facility] bates derm williams     Past Surgical History:   Past Surgical History:  Procedure Laterality Date   BREAST REDUCTION SURGERY  12/2002   [facility]ernes   CARPAL TUNNEL RELEASE Right    carpal tunnel repair   CHOLECYSTECTOMY     COLONOSCOPY     with polyp resection   CYSTOCELE REPAIR     DILATION AND CURETTAGE OF UTERUS     prior to hysterectomy   pre melanoma Left 07/2003   left back    REDUCTION MAMMAPLASTY Bilateral    TOTAL ABDOMINAL HYSTERECTOMY     without oophorectomy, fibroids and endometriosis age 13     Past OB/GYN History: OB History  Gravida Para Term Preterm AB Living  3 3 3     3   SAB IAB Ectopic Multiple Live Births               # Outcome Date GA Lbr Len/2nd Weight Sex Type Anes PTL Lv  3 Term           2 Term           1 Term             Obstetric Comments  7lbs 8oz biggest baby   Vaginal deliveries: 3,  Forceps/ Vacuum deliveries: 1 forceps, Cesarean section: 0 Menopausal: Yes, at age 2 Denies bleeding since menopause Contraception: bilateral salpingectomy. Any history of abnormal pap smears: no.  Medications: Patient has a current medication list which includes the following prescription(s): amlodipine, cholecalciferol, furosemide, hydroxychloroquine, ibuprofen, multivitamin, and nitrostat.   Allergies: Patient is allergic to diuretic [buchu-cornsilk-ch grass-hydran], hctz [hydrochlorothiazide], lisinopril, and diovan [valsartan].   Social History:  Social History   Tobacco Use   Smoking status: Never  Substance Use Topics   Alcohol use: No    Alcohol/week: 0.0 standard drinks of alcohol   Drug use: No    Relationship status: married Patient lives with husband.   Patient is not  employed. Regular exercise: No due to back and hip issues History of abuse: No  Family History:   Family History  Problem Relation Age of Onset   CVA Mother    Pancreatitis Mother    Aneurysm Mother    Stroke Mother    Kidney failure Father    Stroke Father    Colon polyps Father    Hypertension Father    CAD Father    Heart attack Father    Diabetes Father    Melanoma Sister    Sleep apnea Sister    Fibromyalgia Sister    Sleep apnea Brother    Melanoma Brother    Diabetes Mellitus I Brother    Hypertension Brother    Melanoma Brother    Hypertension Brother    Prostate cancer Brother  Lupus Sister      Review of Systems: Review of Systems  Constitutional:  Positive for malaise/fatigue. Negative for fever and weight loss.       Weight gain  Respiratory:  Negative for cough, shortness of breath and wheezing.   Cardiovascular:  Negative for chest pain, palpitations and leg swelling.  Gastrointestinal:  Positive for abdominal pain. Negative for blood in stool.  Genitourinary:  Negative for hematuria.  Skin:  Positive for rash.  Neurological:  Positive for weakness. Negative for dizziness and headaches.  Endo/Heme/Allergies:  Does not bruise/bleed easily.  Psychiatric/Behavioral:  Negative for depression. The patient is not nervous/anxious.      OBJECTIVE Physical Exam: Vitals:   11/05/22 0954  BP: 135/76  Pulse: (!) 53  Weight: 175 lb (79.4 kg)  Height: 5' 2.3" (1.582 m)    Physical Exam Vitals reviewed. Exam conducted with a chaperone present.  Constitutional:      General: She is not in acute distress. Cardiovascular:     Rate and Rhythm: Normal rate.  Pulmonary:     Effort: Pulmonary effort is normal. No respiratory distress.  Abdominal:     General: There is no distension.     Palpations: Abdomen is soft.     Tenderness: There is no abdominal tenderness.    Genitourinary:    Labia:        Right: No rash, tenderness, lesion or injury.         Left: No rash, tenderness, lesion or injury.      Urethra: No prolapse, urethral pain, urethral swelling or urethral lesion.     Vagina: No signs of injury and foreign body. No vaginal discharge, erythema, tenderness, bleeding or lesions.     Uterus: Absent.      Adnexa:        Right: No mass, tenderness or fullness.         Left: No mass, tenderness or fullness.       Rectum: No mass, tenderness or external hemorrhoid. Abnormal anal tone.       Comments: No mesh noted on vaginal exam Musculoskeletal:       Back:  Neurological:     Mental Status: She is alert.      GU / Detailed Urogynecologic Evaluation:  Pelvic Exam: Normal external female genitalia; Bartholin's and Skene's glands normal in appearance; urethral meatus normal in appearance, no urethral masses or discharge.   CST: negative  Reflexes: bulbocavernosis present, anocutaneous present bilaterally.  s/p hysterectomy: Speculum exam reveals normal vaginal mucosa with  atrophy and normal vaginal cuff.  Adnexa no mass, fullness, tenderness.    With apex supported, anterior compartment defect was reduced  Pelvic floor strength III/V, puborectalis III/V external anal sphincter III/V with defect noted around 10-2 o'clock  Pelvic floor musculature: Right levator non-tender, Right obturator non-tender with decreased sensation, Left levator non-tender, Left obturator non-tender  POP-Q:   POP-Q  1                                            Aa   2                                           Ba  2  C   3                                            Gh  4                                            Pb  6                                            tvl   -1                                            Ap  -1                                            Bp                                                 D      Rectal Exam:  Normal sphincter tone, small distal rectocele,  enterocoele not present, no rectal masses, no sign of dyssynergia when asking the patient to bear down.  Post-Void Residual (PVR) by Bladder Scan: In order to evaluate bladder emptying, we discussed obtaining a postvoid residual and patient agreed to this procedure.  Procedure: The ultrasound unit was placed on the patient's abdomen in the suprapubic region after the patient had voided.    Post Void Residual - 11/05/22 1010       Post Void Residual   Post Void Residual 39 mL            HbA1C 5.5 on 08/06/22  Laboratory Results: Voided prior to visit  Lab Results  Component Value Date   CREATININE 0.75 12/01/2014    ASSESSMENT AND PLAN Ms. Mccurley is a 82 y.o. with:   Incontinence of feces, unspecified fecal incontinence type Assessment & Plan: Accidental Bowel Leakage:  - Treatment options include anti-diarrhea medication (loperamide/ Imodium OTC or prescription lomotil), fiber supplements, physical therapy, and possible sacral neuromodulation or surgery.   - encouraged to start fiber supplementation with history of diverticulitis  - start Kegels exercises   Urinary incontinence, mixed Assessment & Plan: - urgency > stress - We discussed the symptoms of overactive bladder (OAB), which include urinary urgency, urinary frequency, nocturia, with or without urge incontinence.  While we do not know the exact etiology of OAB, several treatment options exist. We discussed management including behavioral therapy (decreasing bladder irritants, urge suppression strategies, timed voids, bladder retraining), physical therapy, medication; for refractory cases posterior tibial nerve stimulation, sacral neuromodulation, and intravesical botulinum toxin injection.  For anticholinergic medications, we discussed the potential side effects of anticholinergics including dry eyes, dry mouth, constipation, cognitive impairment and urinary retention. For Beta-3 agonist medication, we discussed the  potential side  effect of elevated blood pressure which is more likely to occur in individuals with uncontrolled hypertension.  - unable to tolerate gemtesa due to dry eyes with visual changes in the setting of Sjogren's - will avoid anti-cholinergics - reviewed and provided handout for botox injections, PTNS, and SNS. Patient desires to avoid invasive interventions at this time.  - encouraged to return to neurology/orthopedic surgery due to history of spinal injections, worsening right leg weakness and change in vulvar sensation in the past 2-3 years. Emphasized importance of repeat evaluation due to change in urinary and bowel symptoms. Patient reports information available at home to call for appt. Patient desires to postpone urinary and bowel symptoms until after re-evaluation by neurology/orthopedic surgery.  - started bladder training and Kegel exercises    Nocturia Assessment & Plan: For night time frequency: - avoid fluid intake after 6pm - elevated feet during the day or use compression socks to reduce lower extremity swelling - switch diuretic (e.g. 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 - encouraged to consider pessary placement if no symptomatic relief after kegel exercises    History of UTI Assessment & Plan: - encouraged to avoid antibiotic use in the absence of urinary symptoms - discussed change in appearance (other than blood) or odor of urine is not a sensitive marker for infection - encouraged to consider resuming vaginal estrogen    Time spent: I spent 68 minutes dedicated to the care of this patient on the date of this  encounter to include pre-visit review of records, face-to-face time with the patient discussing urinary incontinence, pelvic organ prolapse in the setting of back pain and right leg weakness and post visit documentation.   Patient will call back after neurology/orthopedic evaluation, considering pessary use if persistent symptoms.  Loleta Chance, MD

## 2022-11-05 NOTE — Assessment & Plan Note (Signed)
For night time frequency: - avoid fluid intake after 6pm - elevated feet during the day or use compression socks to reduce lower extremity swelling - switch diuretic (e.g. furosemide) dosing to 2pm - resume CPAP for sleep apnea

## 2022-11-05 NOTE — Patient Instructions (Addendum)
Mixed Incontinence (MUI):  MUI includes symptoms of both Overactive bladder (OAB) and stress incontinence (SUI).    Overactive bladder (OAB) causes bladder urgency, frequency, and having to void at night with or without leakage.  Several  treatment options exist, including behavioral changes (avoiding caffeine, etc), physical therapy, medications, and neuromodulation (ways to change the nerve signals to the bladder).   Stress incontinence (SUI) causes urinary leakage with coughing, laughing, sneezing and occasionally during exercise or bending/lifting.  Treatment options include nonsurgical options such as Kegel (pelvic floor) exercises, physical therapy, pessary (vaginal device similar to a diaphragm to prevent leakage) or surgical options such as a midurethral sling.   Accidental Bowel Leakage:  - Treatment options include anti-diarrhea medication (loperamide/ Imodium OTC or prescription lomotil), fiber supplements, physical therapy, and possible sacral neuromodulation or surgery.    For night time frequency: - avoid fluid intake after 6pm - elevated your feet during the day or use compression socks to reduce lower extremity swelling - switch your diuretic (e.g. furosemide) dosing to 2pm if you resume use as needed - resume CPAP for sleep apnea  You have a stage 3 (out of 4) prolapse.  We discussed the fact that it is not life threatening but there are several treatment options. For treatment of pelvic organ prolapse, we discussed options for management including expectant management, conservative management, and surgical management, such as Kegels, a pessary, pelvic floor physical therapy, and specific surgical procedures.      Please return to your neurologist or orthopedic surgeon for workup of your previous spine issues, right leg numbness.   Consider resuming vaginal estrogen if you continue to experience UTI symptoms, avoid antibiotic use in the absence of urinary tract symptoms.

## 2022-11-05 NOTE — Assessment & Plan Note (Signed)
-   For treatment of pelvic organ prolapse, we discussed options for management including expectant management, conservative management, and surgical management, such as Kegels, a pessary, pelvic floor physical therapy, and specific surgical procedures. - encouraged to consider pessary placement if no symptomatic relief after kegel exercises

## 2022-11-05 NOTE — Assessment & Plan Note (Addendum)
-   urgency > stress - We discussed the symptoms of overactive bladder (OAB), which include urinary urgency, urinary frequency, nocturia, with or without urge incontinence.  While we do not know the exact etiology of OAB, several treatment options exist. We discussed management including behavioral therapy (decreasing bladder irritants, urge suppression strategies, timed voids, bladder retraining), physical therapy, medication; for refractory cases posterior tibial nerve stimulation, sacral neuromodulation, and intravesical botulinum toxin injection.  For anticholinergic medications, we discussed the potential side effects of anticholinergics including dry eyes, dry mouth, constipation, cognitive impairment and urinary retention. For Beta-3 agonist medication, we discussed the potential side effect of elevated blood pressure which is more likely to occur in individuals with uncontrolled hypertension.  - unable to tolerate gemtesa due to dry eyes with visual changes in the setting of Sjogren's, however provided resolution of leakage - will avoid anti-cholinergics - reviewed and provided handout for botox injections, PTNS, and SNS. Patient desires to avoid invasive interventions at this time.  - encouraged to return to neurology/orthopedic surgery due to history of spinal injections, worsening right leg weakness and change in vulvar sensation in the past 2-3 years. Emphasized importance of repeat evaluation due to change in urinary and bowel symptoms. Patient reports information available at home to call for appt. Patient desires to postpone urinary and bowel symptoms until after re-evaluation by neurology/orthopedic surgery.  - started bladder training and Kegel exercises

## 2022-11-05 NOTE — Assessment & Plan Note (Signed)
-   encouraged to avoid antibiotic use in the absence of urinary symptoms - discussed change in appearance (other than blood) or odor of urine is not a sensitive marker for infection - encouraged to consider resuming vaginal estrogen

## 2022-11-05 NOTE — Assessment & Plan Note (Signed)
Accidental Bowel Leakage:  - Treatment options include anti-diarrhea medication (loperamide/ Imodium OTC or prescription lomotil), fiber supplements, physical therapy, and possible sacral neuromodulation or surgery.   - encouraged to start fiber supplementation with history of diverticulitis  - start Kegels exercises

## 2022-11-27 ENCOUNTER — Other Ambulatory Visit: Payer: Self-pay | Admitting: Internal Medicine

## 2022-11-27 DIAGNOSIS — Z1231 Encounter for screening mammogram for malignant neoplasm of breast: Secondary | ICD-10-CM

## 2023-01-04 ENCOUNTER — Ambulatory Visit
Admission: RE | Admit: 2023-01-04 | Discharge: 2023-01-04 | Disposition: A | Discharge status: Home / Self Care | Payer: Medicare Other | Source: Ambulatory Visit | Attending: Internal Medicine | Admitting: Internal Medicine

## 2023-01-04 DIAGNOSIS — Z1231 Encounter for screening mammogram for malignant neoplasm of breast: Secondary | ICD-10-CM

## 2023-04-16 ENCOUNTER — Ambulatory Visit
Admission: RE | Admit: 2023-04-16 | Discharge: 2023-04-16 | Disposition: A | Discharge status: Home / Self Care | Payer: Medicare Other | Source: Ambulatory Visit | Attending: Internal Medicine | Admitting: Internal Medicine

## 2023-04-16 DIAGNOSIS — M8588 Other specified disorders of bone density and structure, other site: Secondary | ICD-10-CM

## 2023-06-05 ENCOUNTER — Other Ambulatory Visit: Payer: Self-pay | Admitting: Orthopedic Surgery

## 2023-06-05 DIAGNOSIS — M545 Low back pain, unspecified: Secondary | ICD-10-CM

## 2023-06-05 DIAGNOSIS — M542 Cervicalgia: Secondary | ICD-10-CM

## 2023-06-24 ENCOUNTER — Ambulatory Visit
Admission: RE | Admit: 2023-06-24 | Discharge: 2023-06-24 | Disposition: A | Discharge status: Home / Self Care | Source: Ambulatory Visit | Attending: Orthopedic Surgery | Admitting: Orthopedic Surgery

## 2023-06-24 DIAGNOSIS — M542 Cervicalgia: Secondary | ICD-10-CM

## 2023-06-24 DIAGNOSIS — M545 Low back pain, unspecified: Secondary | ICD-10-CM

## 2024-01-06 ENCOUNTER — Other Ambulatory Visit: Payer: Self-pay | Admitting: Internal Medicine

## 2024-01-06 DIAGNOSIS — Z1231 Encounter for screening mammogram for malignant neoplasm of breast: Secondary | ICD-10-CM

## 2024-01-10 ENCOUNTER — Other Ambulatory Visit: Payer: Self-pay | Admitting: Urology

## 2024-01-17 MED ORDER — POLYETHYLENE GLYCOL 3350 17 GM/SCOOP PO POWD: 238.0000 g | Freq: Once | ORAL | Status: AC

## 2024-02-04 ENCOUNTER — Ambulatory Visit
Admission: RE | Admit: 2024-02-04 | Discharge: 2024-02-04 | Disposition: A | Source: Ambulatory Visit | Attending: Internal Medicine | Admitting: Internal Medicine

## 2024-02-04 DIAGNOSIS — Z1231 Encounter for screening mammogram for malignant neoplasm of breast: Secondary | ICD-10-CM

## 2024-02-05 ENCOUNTER — Other Ambulatory Visit: Payer: Self-pay | Admitting: Urology

## 2024-02-07 NOTE — Patient Instructions (Signed)
 SURGICAL WAITING ROOM VISITATION  Patients having surgery or a procedure may have no more than 2 support people in the waiting area - these visitors may rotate.    Children ages 36 and under will not be able to visit patients in [facility] under most circumstances.   Visitors with respiratory illnesses are discouraged from visiting and should remain at home.  If the patient needs to stay at the hospital during part of their recovery, the visitor guidelines for inpatient rooms apply. Pre-op nurse will coordinate an appropriate time for 1 support person to accompany patient in pre-op.  This support person may not rotate.    Please refer to the [facility] website for the visitor guidelines for Inpatients (after your surgery is over and you are in a regular room).       Your procedure is scheduled on:   02/20/24    Report to [facility] Main Entrance    Report to admitting at  0515 AM   Call this number if you have problems the morning of surgery [phone]   Do not eat food  or drink liquids :After Midnight.                               If you have questions, please contact your surgeons office.     Oral Hygiene is also important to reduce your risk of infection.                                    Remember - BRUSH YOUR TEETH THE MORNING OF SURGERY WITH YOUR REGULAR TOOTHPASTE  DENTURES WILL BE REMOVED PRIOR TO SURGERY PLEASE DO NOT APPLY Poly grip OR ADHESIVES!!!   Do NOT smoke after Midnight   Stop all vitamins and herbal supplements 7 days before surgery.   Take these medicines the morning of surgery with A SIP OF WATER:  amlodipine   DO NOT TAKE ANY ORAL DIABETIC MEDICATIONS DAY OF YOUR SURGERY  Bring CPAP mask and tubing day of surgery.                              You may not have any metal on your body including hair pins, jewelry, and body piercing             Do not wear make-up, lotions, powders, perfumes/cologne, or deodorant  Do  not wear nail polish including gel and S&S, artificial/acrylic nails, or any other type of covering on natural nails including finger and toenails. If you have artificial nails, gel coating, etc. that needs to be removed by a nail salon please have this removed prior to surgery or surgery may need to be canceled/ delayed if the surgeon/ anesthesia feels like they are unable to be safely monitored.   Do not shave  48 hours prior to surgery.               Men may shave face and neck.   Do not bring valuables to the hospital. [place] [facility] IS NOT             RESPONSIBLE   FOR VALUABLES.   Contacts, glasses, dentures or bridgework may not be worn into surgery.   Bring small overnight bag day of surgery.   DO NOT [facility] HOME MEDICATIONS  TO THE HOSPITAL. PHARMACY WILL DISPENSE MEDICATIONS LISTED ON YOUR MEDICATION LIST TO YOU DURING YOUR ADMISSION IN THE HOSPITAL!    Patients discharged on the day of surgery will not be allowed to drive home.  Someone NEEDS to stay with you for the first 24 hours after anesthesia.   Special Instructions: Bring a copy of your healthcare power of attorney and living will documents the day of surgery if you haven't scanned them before.              Please read over the following fact sheets you were given: IF YOU HAVE QUESTIONS ABOUT YOUR PRE-OP INSTRUCTIONS PLEASE CALL [phone].   If you received a COVID test during your pre-op visit  it is requested that you wear a mask when out in public, stay away from anyone that may not be feeling well and notify your surgeon if you develop symptoms. If you test positive for Covid or have been in contact with anyone that has tested positive in the last 10 days please notify you surgeon.    [facility] - Preparing for Surgery Before surgery, you can play an important role.  Because skin is not sterile, your skin needs to be as free of germs as possible.  You can reduce the number of germs on your skin by washing with CHG  (chlorahexidine gluconate) soap before surgery.  CHG is an antiseptic cleaner which kills germs and bonds with the skin to continue killing germs even after washing. Please DO NOT use if you have an allergy to CHG or antibacterial soaps.  If your skin becomes reddened/irritated stop using the CHG and inform your nurse when you arrive at Short Stay. Do not shave (including legs and underarms) for at least 48 hours prior to the first CHG shower.  You may shave your face/neck.  Please follow these instructions carefully:  1.  Shower with CHG Soap the night before surgery ONLY (DO NOT USE THE SOAP THE MORNING OF SURGERY).  2.  If you choose to wash your hair, wash your hair first as usual with your normal  shampoo.  3.  After you shampoo, rinse your hair and body thoroughly to remove the shampoo.                             4.  Use CHG as you would any other liquid soap.  You can apply chg directly to the skin and wash.  Gently with a scrungie or clean washcloth.  5.  Apply the CHG Soap to your body ONLY FROM THE NECK DOWN.   Do   not use on face/ open                           Wound or open sores. Avoid contact with eyes, ears mouth and   genitals (private parts).                       Wash face,  Genitals (private parts) with your normal soap.             6.  Wash thoroughly, paying special attention to the area where your    surgery  will be performed.  7.  Thoroughly rinse your body with warm water from the neck down.  8.  DO NOT shower/wash with your normal soap after using and rinsing off the CHG Soap.  9.  Pat yourself dry with a clean towel.            10.  Wear clean pajamas.            11.  Place clean sheets on your bed the night of your first shower and do not  sleep with pets. Day of Surgery : Do not apply any CHG, lotions/deodorants the morning of surgery.  Please wear clean clothes to the hospital/surgery center.  FAILURE TO FOLLOW THESE INSTRUCTIONS MAY RESULT IN THE  CANCELLATION OF YOUR SURGERY  PATIENT SIGNATURE_________________________________  NURSE SIGNATURE__________________________________  ________________________________________________________________________

## 2024-02-07 NOTE — Progress Notes (Addendum)
 Anesthesia Review:  PCP: Vara darajan  Cardiologist : none   PPM/ ICD: Device Orders: Rep Notified:  Chest x-ray : EKG :02/11/24  Echo : 2016  Stress test: 2016  Cardiac Cath :   Activity level: can do a flight of stairs without difficutly  Sleep Study/ CPAP : has cpap  Fasting Blood Sugar :      / Checks Blood Sugar -- times a day:    Blood Thinner/ Instructions /Last Dose: ASA / Instructions/ Last Dose :    PT unable to obtain ruine sampel at preop appt,. Has cup with her and wipes and bag and will call nurse and deliver when she is able to do a sample.  PT is unclear of some of bowel prep instrucgtons from [facility] .  Plans to go by there after preop and obtain.  PT does not have instructions with her and cannot find them.    Called pt on 02/11/24 at 1634 and pt states she was waiting on a call from [facility] in regards to clarificaton of bowel prep instructions.  PT states she was also uanble to do a urine sample today but will try on 02/12/24 and call preop nurse and bring to hospital.

## 2024-02-11 ENCOUNTER — Encounter (HOSPITAL_COMMUNITY)
Admission: RE | Admit: 2024-02-11 | Discharge: 2024-02-11 | Disposition: A | Discharge status: Home / Self Care | Source: Ambulatory Visit | Attending: Urology | Admitting: Urology

## 2024-02-11 ENCOUNTER — Encounter (HOSPITAL_COMMUNITY): Payer: Self-pay

## 2024-02-11 ENCOUNTER — Other Ambulatory Visit: Payer: Self-pay

## 2024-02-11 VITALS — BP 152/70 | HR 53 | Temp 98.1°F | Resp 16 | Ht 63.5 in | Wt 175.0 lb

## 2024-02-11 DIAGNOSIS — R001 Bradycardia, unspecified: Secondary | ICD-10-CM | POA: Insufficient documentation

## 2024-02-11 DIAGNOSIS — K219 Gastro-esophageal reflux disease without esophagitis: Secondary | ICD-10-CM | POA: Insufficient documentation

## 2024-02-11 DIAGNOSIS — I10 Essential (primary) hypertension: Secondary | ICD-10-CM | POA: Diagnosis not present

## 2024-02-11 DIAGNOSIS — M3500 Sicca syndrome, unspecified: Secondary | ICD-10-CM | POA: Diagnosis not present

## 2024-02-11 DIAGNOSIS — G4733 Obstructive sleep apnea (adult) (pediatric): Secondary | ICD-10-CM | POA: Insufficient documentation

## 2024-02-11 DIAGNOSIS — I083 Combined rheumatic disorders of mitral, aortic and tricuspid valves: Secondary | ICD-10-CM | POA: Diagnosis not present

## 2024-02-11 DIAGNOSIS — N8189 Other female genital prolapse: Secondary | ICD-10-CM | POA: Diagnosis not present

## 2024-02-11 DIAGNOSIS — Z01818 Encounter for other preprocedural examination: Secondary | ICD-10-CM | POA: Diagnosis not present

## 2024-02-11 DIAGNOSIS — M329 Systemic lupus erythematosus, unspecified: Secondary | ICD-10-CM | POA: Insufficient documentation

## 2024-02-11 HISTORY — DX: Unspecified osteoarthritis, unspecified site: M19.90

## 2024-02-11 HISTORY — DX: Sjogren syndrome, unspecified: M35.00

## 2024-02-11 HISTORY — DX: Systemic lupus erythematosus, unspecified: M32.9

## 2024-02-11 LAB — BASIC METABOLIC PANEL WITH GFR
Anion gap: 7 (ref 5–15)
BUN: 24 mg/dL — ABNORMAL HIGH (ref 8–23)
CO2: 28 mmol/L (ref 22–32)
Calcium: 10.5 mg/dL — ABNORMAL HIGH (ref 8.9–10.3)
Chloride: 109 mmol/L (ref 98–111)
Creatinine, Ser: 0.91 mg/dL (ref 0.44–1.00)
GFR, Estimated: >60 mL/min
Glucose, Bld: 88 mg/dL (ref 70–99)
Potassium: 3.9 mmol/L (ref 3.5–5.1)
Sodium: 144 mmol/L (ref 135–145)

## 2024-02-11 LAB — CBC
HCT: 42.4 % (ref 36.0–46.0)
Hemoglobin: 13.8 g/dL (ref 12.0–15.0)
MCH: 30.1 pg (ref 26.0–34.0)
MCHC: 32.5 g/dL (ref 30.0–36.0)
MCV: 92.6 fL (ref 80.0–100.0)
Platelets: 228 K/uL (ref 150–400)
RBC: 4.58 MIL/uL (ref 3.87–5.11)
RDW: 13.2 % (ref 11.5–15.5)
WBC: 6.5 K/uL (ref 4.0–10.5)
nRBC: 0 % (ref 0.0–0.2)

## 2024-02-12 ENCOUNTER — Encounter (HOSPITAL_COMMUNITY)
Admission: RE | Admit: 2024-02-12 | Discharge: 2024-02-12 | Disposition: A | Discharge status: Home / Self Care | Source: Ambulatory Visit | Attending: Urology

## 2024-02-12 ENCOUNTER — Encounter (HOSPITAL_COMMUNITY): Admission: RE | Admit: 2024-02-12 | Source: Ambulatory Visit

## 2024-02-12 DIAGNOSIS — Z01812 Encounter for preprocedural laboratory examination: Secondary | ICD-10-CM | POA: Diagnosis present

## 2024-02-12 DIAGNOSIS — R82998 Other abnormal findings in urine: Secondary | ICD-10-CM | POA: Diagnosis not present

## 2024-02-12 DIAGNOSIS — Z01818 Encounter for other preprocedural examination: Secondary | ICD-10-CM

## 2024-02-12 LAB — URINALYSIS, W/ REFLEX TO CULTURE (INFECTION SUSPECTED)
Bacteria, UA: NONE SEEN
Bilirubin Urine: NEGATIVE
Glucose, UA: NEGATIVE mg/dL
Hgb urine dipstick: NEGATIVE
Ketones, ur: NEGATIVE mg/dL
Leukocytes,Ua: NEGATIVE
Nitrite: NEGATIVE
Protein, ur: NEGATIVE mg/dL
Specific Gravity, Urine: 1.018 (ref 1.005–1.030)
pH: 5 (ref 5.0–8.0)

## 2024-02-13 LAB — URINE CULTURE: Culture: NO GROWTH

## 2024-02-13 NOTE — Progress Notes (Signed)
 " Case: [id number] Date/Time: 02/20/24 0715   Procedures:      SACROCOLPOPEXY, ROBOT-ASSISTED, LAPAROSCOPIC - ROBOTIC ASSISTED SACROCOLPOPEXY     CYSTOSCOPY     INJECTION, BULKING AGENT, URETHRA   Anesthesia type: General   Diagnosis: Other female genital prolapse [N81.89]   Pre-op diagnosis: PELVIC ORGAN PROLAPSE,MIDLINE CYSTOCELE   Location: [facility] ROOM 03 / [facility]   Surgeons: Cam Morene ORN, MD       DISCUSSION: Kerry Foster is an 84 yo female with PMH of HTN, OSA on CPAP, GERD, PUD, lupus, sjogren's syndrome, arthritis,   Hx of lupus and sjogren's syndrome. On Plaquenil.  Evaluated by Cardiology in 2016 for chest pain. Had a negative ST and Echo which showed normal LVEF 60-65%, grade I DD, mild TR, mild PR. Echo was repeated by PCP in 2024 and was similar with normal LVEF grade 1 diastolic dysfunction, calcified aortic valve, mild MR and mild TR.  Last seen by PCP in 08/2023 for annual visit. Stable at that time.  VS: BP (!) 152/70   Pulse (!) 53   Temp 36.7 C (Oral)   Resp 16   Ht 5' 3.5 (1.613 m)   Wt 79.4 kg   SpO2 97%   BMI 30.51 kg/m   PROVIDERS: Elliot Charm, MD   LABS: Labs reviewed: Acceptable for surgery. (all labs ordered are listed, but only abnormal results are displayed)  Labs Reviewed  BASIC METABOLIC PANEL WITH GFR - Abnormal; Notable for the following components:      Result Value   BUN 24 (*)    Calcium 10.5 (*)    All other components within normal limits  CBC  TYPE AND SCREEN    EKG 02/11/24  Sinus bradycardia, rate 50  Stress test 11/22/2014:  Nuclear stress EF: 77%. Normal LV function. There was no ST segment deviation noted during stress. The study is normal. No evidence of ischemia. This is a low risk study.  Echo 11/22/2014:  Study Conclusions  - Left ventricle: The cavity size was normal. Wall thickness was   increased in a pattern of mild LVH. Systolic function was normal.   The estimated ejection fraction was  in the range of 60% to 65%.   Wall motion was normal; there were no regional wall motion   abnormalities. Doppler parameters are consistent with abnormal   left ventricular relaxation (grade 1 diastolic dysfunction). The   E/e&' ratio is >15, suggesting elevated LV filling pressure. - Mitral valve: Mildly thickened leaflets . There was trivial   regurgitation. - Tricuspid valve: There was mild regurgitation. - Pulmonic valve: There was mild regurgitation. - Pulmonary arteries: PA peak pressure: 23 mm Hg (S). - Inferior vena cava: The vessel was normal in size. The   respirophasic diameter changes were in the normal range (>= 50%),   consistent with normal central venous pressure.  Impressions:  - LVEF 60-65%, mild LVH, normal wall motion, diastolic dysfunction,   elevated LV filling pressure, trifial MR, mild TR, mild PI,   normal RVSP.   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, right leg, initial encounter    [place]   Lumbar degenerative disc disease    facet DJD.([place])   Lupus (systemic lupus erythematosus) (HCC)    Moderate obstructive sleep apnea    no cpap   Pancolonic diverticulosis    several episodes   Seasonal allergic rhinitis    Sjogren's syndrome    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, ANA+, SSA+, SSB+, arthritis/arthralgias, fatigue, severe slcca-improved on PLQ, Hawkes ophtho hecker rheum hawkes ENT bates derm williams    Past Surgical History:  Procedure Laterality Date   BREAST REDUCTION SURGERY  12/2002   [facility]   CARPAL TUNNEL RELEASE Right     carpal tunnel repair   CHOLECYSTECTOMY     COLONOSCOPY     with polyp resection   CYSTOCELE REPAIR     DILATION AND CURETTAGE OF UTERUS     prior to hysterectomy   pre melanoma Left 07/2003   left back    REDUCTION MAMMAPLASTY Bilateral    TOTAL ABDOMINAL HYSTERECTOMY     without oophorectomy, fibroids and endometriosis age 67    MEDICATIONS:  acetaminophen  (TYLENOL ) 500 MG tablet   amLODipine (NORVASC) 5 MG tablet   cholecalciferol (VITAMIN D) 1000 UNITS tablet   estradiol (ESTRACE) 0.01 % CREA vaginal cream   furosemide  (LASIX ) 20 MG tablet   hydroxychloroquine (PLAQUENIL) 200 MG tablet   Menthol, Topical Analgesic, (TWO OLD GOATS ARTHRITIS EX)   Multiple Vitamin (MULTIVITAMIN WITH MINERALS) TABS tablet   Polyethyl Glyc-Propyl Glyc PF (SYSTANE ULTRA PF) 0.4-0.3 % SOLN   trimethoprim (TRIMPEX) 100 MG tablet    polyethylene glycol powder (GLYCOLAX /MIRALAX ) container 238 g   Burnard CHRISTELLA Odis DEVONNA [facility] Short Stay/Anesthesiology [facility] Phone [phone] 02/13/2024 6:07 PM        "

## 2024-02-13 NOTE — Anesthesia Preprocedure Evaluation (Addendum)
"                                    Anesthesia Evaluation  Patient identified by MRN, date of birth, ID band Patient awake    Reviewed: Allergy & Precautions, NPO status , Patient's Chart, lab work & pertinent test results  Airway Mallampati: II  TM Distance: >3 FB Neck ROM: Full    Dental   Pulmonary sleep apnea    breath sounds clear to auscultation       Cardiovascular hypertension, Pt. on medications  Rhythm:Regular Rate:Normal     Neuro/Psych  Neuromuscular disease    GI/Hepatic Neg liver ROS,GERD  ,,  Endo/Other  negative endocrine ROS    Renal/GU negative Renal ROS     Musculoskeletal  (+) Arthritis ,    Abdominal   Peds  Hematology negative hematology ROS (+)   Anesthesia Other Findings Lupus, Sjogrens's  Reproductive/Obstetrics                              Anesthesia Physical Anesthesia Plan  ASA: 2  Anesthesia Plan: General   Post-op Pain Management: Tylenol  PO (pre-op)*, Precedex and Lidocaine  infusion*   Induction: Intravenous  PONV Risk Score and Plan: 4 or greater and Dexamethasone , Ondansetron , Propofol  infusion and Treatment may vary due to age or medical condition  Airway Management Planned: Oral ETT  Additional Equipment:   Intra-op Plan:   Post-operative Plan: Extubation in OR  Informed Consent: I have reviewed the patients History and Physical, chart, labs and discussed the procedure including the risks, benefits and alternatives for the proposed anesthesia with the patient or authorized representative who has indicated his/her understanding and acceptance.     Dental advisory given  Plan Discussed with: CRNA  Anesthesia Plan Comments:          Anesthesia Quick Evaluation  "

## 2024-02-20 ENCOUNTER — Ambulatory Visit (HOSPITAL_COMMUNITY): Payer: Self-pay | Admitting: Medical

## 2024-02-20 ENCOUNTER — Encounter (HOSPITAL_COMMUNITY)
Admission: RE | Disposition: A | Discharge status: Home / Self Care | Payer: Self-pay | Source: Home / Self Care | Attending: Urology

## 2024-02-20 ENCOUNTER — Other Ambulatory Visit: Payer: Self-pay

## 2024-02-20 ENCOUNTER — Observation Stay (HOSPITAL_COMMUNITY)
Admission: RE | Admit: 2024-02-20 | Discharge: 2024-02-21 | Disposition: A | Discharge status: Home / Self Care | Attending: Urology | Admitting: Urology

## 2024-02-20 ENCOUNTER — Ambulatory Visit (HOSPITAL_BASED_OUTPATIENT_CLINIC_OR_DEPARTMENT_OTHER): Payer: Self-pay

## 2024-02-20 ENCOUNTER — Encounter (HOSPITAL_COMMUNITY): Payer: Self-pay | Admitting: Urology

## 2024-02-20 DIAGNOSIS — I10 Essential (primary) hypertension: Secondary | ICD-10-CM | POA: Diagnosis not present

## 2024-02-20 DIAGNOSIS — Z01818 Encounter for other preprocedural examination: Principal | ICD-10-CM

## 2024-02-20 DIAGNOSIS — N819 Female genital prolapse, unspecified: Secondary | ICD-10-CM | POA: Diagnosis present

## 2024-02-20 DIAGNOSIS — N8111 Cystocele, midline: Principal | ICD-10-CM | POA: Insufficient documentation

## 2024-02-20 LAB — TYPE AND SCREEN
ABO/RH(D): O POS
Antibody Screen: NEGATIVE

## 2024-02-20 LAB — ABO/RH: ABO/RH(D): O POS

## 2024-02-20 MED ORDER — FENTANYL CITRATE (PF) 100 MCG/2ML IJ SOLN
INTRAMUSCULAR | Status: AC
  Filled 2024-02-20: qty 2

## 2024-02-20 MED ORDER — LIDOCAINE HCL (PF) 2 % IJ SOLN
INTRAMUSCULAR | Status: AC
  Filled 2024-02-20: qty 5

## 2024-02-20 MED ORDER — ORAL CARE MOUTH RINSE: 15.0000 mL | Freq: Once | OROMUCOSAL | Status: AC

## 2024-02-20 MED ORDER — ROCURONIUM BROMIDE 10 MG/ML (PF) SYRINGE
PREFILLED_SYRINGE | INTRAVENOUS | Status: DC | PRN
  Administered 2024-02-20: 20 mg via INTRAVENOUS
  Administered 2024-02-20: 10 mg via INTRAVENOUS
  Administered 2024-02-20: 50 mg via INTRAVENOUS
  Administered 2024-02-20: 20 mg via INTRAVENOUS

## 2024-02-20 MED ORDER — LIDOCAINE HCL 2 % IJ SOLN
INTRAMUSCULAR | Status: AC
  Filled 2024-02-20: qty 20

## 2024-02-20 MED ORDER — ACETAMINOPHEN 500 MG PO TABS
1000.0000 mg | ORAL_TABLET | Freq: Four times a day (QID) | ORAL | Status: DC
  Administered 2024-02-20 – 2024-02-21 (×3): 1000 mg via ORAL
  Filled 2024-02-20 (×4): qty 2

## 2024-02-20 MED ORDER — SULFAMETHOXAZOLE-TRIMETHOPRIM 800-160 MG PO TABS: 1.0000 | ORAL_TABLET | Freq: Two times a day (BID) | ORAL | 0 refills | Status: AC

## 2024-02-20 MED ORDER — LACTATED RINGERS IR SOLN
Status: DC | PRN
  Administered 2024-02-20: 1000 mL

## 2024-02-20 MED ORDER — HYDROXYCHLOROQUINE SULFATE 200 MG PO TABS
400.0000 mg | ORAL_TABLET | Freq: Every day | ORAL | Status: DC
  Administered 2024-02-21: 400 mg via ORAL
  Filled 2024-02-20: qty 2

## 2024-02-20 MED ORDER — ONDANSETRON HCL 4 MG/2ML IJ SOLN
INTRAMUSCULAR | Status: DC | PRN
  Administered 2024-02-20: 4 mg via INTRAVENOUS

## 2024-02-20 MED ORDER — OXYCODONE HCL 5 MG PO TABS: 5.0000 mg | ORAL_TABLET | ORAL | Status: DC | PRN

## 2024-02-20 MED ORDER — SENNA 8.6 MG PO TABS
1.0000 | ORAL_TABLET | Freq: Two times a day (BID) | ORAL | Status: DC
  Administered 2024-02-20 – 2024-02-21 (×2): 8.6 mg via ORAL
  Filled 2024-02-20 (×2): qty 1

## 2024-02-20 MED ORDER — CHLORHEXIDINE GLUCONATE CLOTH 2 % EX PADS
6.0000 | MEDICATED_PAD | Freq: Every day | CUTANEOUS | Status: DC
  Administered 2024-02-20: 6 via TOPICAL

## 2024-02-20 MED ORDER — TRAMADOL HCL 50 MG PO TABS: 50.0000 mg | ORAL_TABLET | Freq: Four times a day (QID) | ORAL | 0 refills | Status: AC | PRN

## 2024-02-20 MED ORDER — SUGAMMADEX SODIUM 200 MG/2ML IV SOLN
INTRAVENOUS | Status: DC | PRN
  Administered 2024-02-20: 150 mg via INTRAVENOUS

## 2024-02-20 MED ORDER — KETOROLAC TROMETHAMINE 15 MG/ML IJ SOLN
15.0000 mg | Freq: Four times a day (QID) | INTRAMUSCULAR | Status: DC
  Administered 2024-02-20 – 2024-02-21 (×4): 15 mg via INTRAVENOUS
  Filled 2024-02-20 (×4): qty 1

## 2024-02-20 MED ORDER — DOCUSATE SODIUM 100 MG PO CAPS
100.0000 mg | ORAL_CAPSULE | Freq: Two times a day (BID) | ORAL | Status: DC
  Administered 2024-02-20 – 2024-02-21 (×2): 100 mg via ORAL
  Filled 2024-02-20 (×2): qty 1

## 2024-02-20 MED ORDER — ACETAMINOPHEN 500 MG PO TABS
1000.0000 mg | ORAL_TABLET | Freq: Once | ORAL | Status: AC
  Administered 2024-02-20: 1000 mg via ORAL
  Filled 2024-02-20: qty 2

## 2024-02-20 MED ORDER — EPHEDRINE SULFATE-NACL 50-0.9 MG/10ML-% IV SOSY
PREFILLED_SYRINGE | INTRAVENOUS | Status: DC | PRN
  Administered 2024-02-20 (×3): 5 mg via INTRAVENOUS

## 2024-02-20 MED ORDER — EPHEDRINE 5 MG/ML INJ
INTRAVENOUS | Status: AC
  Filled 2024-02-20: qty 5

## 2024-02-20 MED ORDER — STERILE WATER FOR IRRIGATION IR SOLN
Status: DC | PRN
  Administered 2024-02-20: 1000 mL
  Administered 2024-02-20: 3000 mL via INTRAVESICAL

## 2024-02-20 MED ORDER — CEFAZOLIN SODIUM-DEXTROSE 2-4 GM/100ML-% IV SOLN
2.0000 g | INTRAVENOUS | Status: AC
  Administered 2024-02-20: 2 g via INTRAVENOUS
  Filled 2024-02-20: qty 100

## 2024-02-20 MED ORDER — FENTANYL CITRATE (PF) 50 MCG/ML IJ SOSY
PREFILLED_SYRINGE | INTRAMUSCULAR | Status: AC
  Filled 2024-02-20: qty 1

## 2024-02-20 MED ORDER — FENTANYL CITRATE (PF) 50 MCG/ML IJ SOSY
25.0000 ug | PREFILLED_SYRINGE | INTRAMUSCULAR | Status: DC | PRN
  Administered 2024-02-20: 50 ug via INTRAVENOUS
  Administered 2024-02-20: 25 ug via INTRAVENOUS
  Administered 2024-02-20: 50 ug via INTRAVENOUS
  Administered 2024-02-20: 25 ug via INTRAVENOUS

## 2024-02-20 MED ORDER — TRIMETHOPRIM 100 MG PO TABS
100.0000 mg | ORAL_TABLET | Freq: Every evening | ORAL | Status: DC
  Administered 2024-02-20: 100 mg via ORAL
  Filled 2024-02-20: qty 1

## 2024-02-20 MED ORDER — CHLORHEXIDINE GLUCONATE 0.12 % MT SOLN
15.0000 mL | Freq: Once | OROMUCOSAL | Status: AC
  Administered 2024-02-20: 15 mL via OROMUCOSAL

## 2024-02-20 MED ORDER — BUPIVACAINE-EPINEPHRINE 0.5% -1:200000 IJ SOLN
INTRAMUSCULAR | Status: DC | PRN
  Administered 2024-02-20: 30 mL

## 2024-02-20 MED ORDER — PROPOFOL 10 MG/ML IV BOLUS
INTRAVENOUS | Status: AC
  Filled 2024-02-20: qty 20

## 2024-02-20 MED ORDER — SODIUM CHLORIDE 0.9 % IV SOLN: INTRAVENOUS | Status: AC

## 2024-02-20 MED ORDER — DEXAMETHASONE SOD PHOSPHATE PF 10 MG/ML IJ SOLN
INTRAMUSCULAR | Status: AC
  Filled 2024-02-20: qty 1

## 2024-02-20 MED ORDER — AMLODIPINE BESYLATE 5 MG PO TABS
5.0000 mg | ORAL_TABLET | Freq: Every day | ORAL | Status: DC
  Administered 2024-02-21: 5 mg via ORAL
  Filled 2024-02-20: qty 1

## 2024-02-20 MED ORDER — LIDOCAINE HCL (PF) 2 % IJ SOLN
INTRAMUSCULAR | Status: DC | PRN
  Administered 2024-02-20: 40 mg via INTRADERMAL
  Administered 2024-02-20: 1.5 mg/kg/h via INTRADERMAL

## 2024-02-20 MED ORDER — SUGAMMADEX SODIUM 200 MG/2ML IV SOLN
INTRAVENOUS | Status: AC
  Filled 2024-02-20: qty 2

## 2024-02-20 MED ORDER — FENTANYL CITRATE (PF) 100 MCG/2ML IJ SOLN
INTRAMUSCULAR | Status: DC | PRN
  Administered 2024-02-20: 25 ug via INTRAVENOUS
  Administered 2024-02-20: 100 ug via INTRAVENOUS
  Administered 2024-02-20 (×3): 25 ug via INTRAVENOUS

## 2024-02-20 MED ORDER — DEXAMETHASONE SOD PHOSPHATE PF 10 MG/ML IJ SOLN
INTRAMUSCULAR | Status: DC | PRN
  Administered 2024-02-20: 8 mg via INTRAVENOUS

## 2024-02-20 MED ORDER — OXYCODONE HCL 5 MG/5ML PO SOLN: 5.0000 mg | Freq: Once | ORAL | Status: DC | PRN

## 2024-02-20 MED ORDER — PHENYLEPHRINE HCL-NACL 20-0.9 MG/250ML-% IV SOLN
INTRAVENOUS | Status: DC | PRN
  Administered 2024-02-20: 25 ug/min via INTRAVENOUS

## 2024-02-20 MED ORDER — OXYCODONE HCL 5 MG PO TABS: 5.0000 mg | ORAL_TABLET | Freq: Once | ORAL | Status: DC | PRN

## 2024-02-20 MED ORDER — BUPIVACAINE-EPINEPHRINE (PF) 0.5% -1:200000 IJ SOLN
INTRAMUSCULAR | Status: AC
  Filled 2024-02-20: qty 30

## 2024-02-20 MED ORDER — ROCURONIUM BROMIDE 10 MG/ML (PF) SYRINGE
PREFILLED_SYRINGE | INTRAVENOUS | Status: AC
  Filled 2024-02-20: qty 10

## 2024-02-20 MED ORDER — PROPOFOL 10 MG/ML IV BOLUS
INTRAVENOUS | Status: DC | PRN
  Administered 2024-02-20: 120 mg via INTRAVENOUS
  Administered 2024-02-20: 20 mg via INTRAVENOUS

## 2024-02-20 MED ORDER — LACTATED RINGERS IV SOLN: INTRAVENOUS | Status: DC

## 2024-02-20 MED ORDER — ESTRADIOL 0.01 % VA CREA
TOPICAL_CREAM | VAGINAL | Status: AC
  Filled 2024-02-20: qty 42.5

## 2024-02-20 MED ORDER — AMISULPRIDE (ANTIEMETIC) 5 MG/2ML IV SOLN: 10.0000 mg | Freq: Once | INTRAVENOUS | Status: DC | PRN

## 2024-02-20 MED ORDER — PHENYLEPHRINE 80 MCG/ML (10ML) SYRINGE FOR IV PUSH (FOR BLOOD PRESSURE SUPPORT)
PREFILLED_SYRINGE | INTRAVENOUS | Status: AC
  Filled 2024-02-20: qty 10

## 2024-02-20 MED ORDER — BUPIVACAINE LIPOSOME 1.3 % IJ SUSP
INTRAMUSCULAR | Status: AC
  Filled 2024-02-20: qty 20

## 2024-02-20 MED ORDER — ESTRADIOL 0.01 % VA CREA
TOPICAL_CREAM | VAGINAL | Status: DC | PRN
  Administered 2024-02-20: 1 via VAGINAL

## 2024-02-20 MED ORDER — FENTANYL CITRATE (PF) 50 MCG/ML IJ SOSY
PREFILLED_SYRINGE | INTRAMUSCULAR | Status: AC
  Filled 2024-02-20: qty 2

## 2024-02-20 MED ORDER — BUPIVACAINE LIPOSOME 1.3 % IJ SUSP
INTRAMUSCULAR | Status: DC | PRN
  Administered 2024-02-20: 20 mL

## 2024-02-20 MED ORDER — ONDANSETRON HCL 4 MG/2ML IJ SOLN
INTRAMUSCULAR | Status: AC
  Filled 2024-02-20: qty 2

## 2024-02-20 NOTE — Interval H&P Note (Signed)
 History and Physical Interval Note:  02/20/2024 7:10 AM  Kerry Foster  has presented today for surgery, with the diagnosis of PELVIC ORGAN PROLAPSE,MIDLINE CYSTOCELE.  The various methods of treatment have been discussed with the patient and family. After consideration of risks, benefits and other options for treatment, the patient has consented to  Procedures with comments: SACROCOLPOPEXY, ROBOT-ASSISTED, LAPAROSCOPIC (N/A) - ROBOTIC ASSISTED SACROCOLPOPEXY CYSTOSCOPY (N/A) INJECTION, BULKING AGENT, URETHRA (N/A) as a surgical intervention.  The patient's history has been reviewed, patient examined, no change in status, stable for surgery.  I have reviewed the patient's chart and labs.  Questions were answered to the patient's satisfaction.     [name]

## 2024-02-20 NOTE — Transfer of Care (Signed)
 Immediate Anesthesia Transfer of Care Note  Patient: DANELLE CURIALE  Procedure(s) Performed: SACROCOLPOPEXY, ROBOT-ASSISTED, LAPAROSCOPIC (Abdomen) CYSTOSCOPY INJECTION, BULKING AGENT, URETHRA  Patient Location: PACU  Anesthesia Type:General  Level of Consciousness: awake, alert , oriented, and patient cooperative  Airway & Oxygen Therapy: Patient Spontanous Breathing and Patient connected to face mask oxygen  Post-op Assessment: Report given to RN, Post -op Vital signs reviewed and stable, and Patient moving all extremities  Post vital signs: Reviewed and stable  Last Vitals:  Vitals Value Taken Time  BP 137/51 02/20/24 11:45  Temp    Pulse 55 02/20/24 11:47  Resp 11 02/20/24 11:47  SpO2 100 % 02/20/24 11:47  Vitals shown include unfiled device data.  Last Pain:  Vitals:   02/20/24 0623  TempSrc:   PainSc: 0-No pain         Complications: No notable events documented.

## 2024-02-20 NOTE — Op Note (Signed)
 Preoperative diagnosis:  Pelvic organ prolapse   Postoperative diagnosis:  same   Procedure: Robotic assisted laparoscopic sacrocolpopexy  Surgeon: Morene MICAEL Salines, MD 1st assistant: Jacqulyn Bound, MD  Anesthesia: General  Complications: None  Intraoperative findings: [employer] scientific Upsilon Y-Mesh placed, cut to specific vaginal length  EBL: 50cc  Specimens: None  Indication: Kerry Foster is a 84 y.o. female patient with symptomatic pelvic organ prolapse.  After reviewing the management options for treatment, he elected to proceed with the above surgical procedure(s). We have discussed the potential benefits and risks of the procedure, side effects of the proposed treatment, the likelihood of the patient achieving the goals of the procedure, and any potential problems that might occur during the procedure or recuperation. Informed consent has been obtained.  Description of procedure:  A Foley catheter was then placed and placed to gravity drainage. I then made a periumbilical incision carrying the dissection down to the patient's fascia with electrocautery.  Once to the fascia, the fascia was incised the peritoneum opened.  A 8mm port was then placed into the abdomen.  The abdomen was insufflated and the remaining ports placed under digital guidance.  2 ports were placed lateral to the umbilicus on the right proximally 10 cm apart.  The most lateral port was approximately 3 cm above the anterior iliac spine.  2 additional ports were placed in the patient's right side in comparable positions to the most lateral port on the right was a 12 mm port.the robot was then docked at an angle from the leg obliquely along the side of the left leg.  We then began our surgery by cleaning up some of the pelvic adhesions to the small bowel and colon.  Once this was completed I started dissecting at the sacral promontory located 3 cm medial to the location where the ureter crosses over the iliac  vessels at the pelvic brim. The posterior peritoneum was incised and the sacral prominence cleared off an area taking care to avoid the middle sacral vessels and the iliac branches.  I then created a posterior peritoneal tunnel starting at the sacral promontory and tunneling down the right pelvic sidewall down into the pelvis breaking back through the posterior peritoneum around the vesico-vaginal junction posteriorly.  I then continued the posterior dissection retracting down on the rectum and finding the avascular plane between the posterior vaginal wall and the rectum.  I carried this dissection down as far as I could to along the area of the perineal body.  I then turned my attention to the anterior plane between the anterior vaginal wall and the bladder.  I was able to obtain access to the avascular plane and with a combination of both monopolar cautery and blunt dissection was able to clean and nice down to the bladder neck.  I then turned my attention back to the patient's uterus and skeletonized the right uterine artery and vein and then took this with a series of bipolar moves.  I then performed a similar uterus pedicle ligation on the left.    Mesh was measured at approximately 10 cm anteriorly and 10.52 cm posteriorly and I cut this on the back table.  The mesh was then placed into the patient's abdomen through the assistant port and the anterior leaf was secured down onto the anterior vaginal wall with the apex at the bladder neck.  The posterior leaf was then secured down on the posterior vaginal wall.  These were sewn down with 2-0 Vicryl.  Between 6 and 8 were done on each side.  At this point I then went back to the previously dissected sacral promontory and posterior peritoneal tunnel and inserted a instrument through the tunnel and grasped the end of the mesh at the vaginal cuff and pull it up to the sacrum.  I then checked to ensure that the sacral mesh was not too tight by performing a vaginal  exam.  I then secured the sacral leg of the mesh using a 0 Prolene.  I then reapproximated the posterior peritoneum with a 2-0 Vicryl in a running fashion around the sacral promontory.  The pelvic peritoneum was closed using a pursestring.   The fascia of the 12 mm port was then closed with 0 Vicryl in a figure-of-eight fashion.  The skin was closed with 4-0 Monocryl's.  Dermabond was applied to the incision and exparel  injected into the incisions.  The cystoscope was assembled with the Bulkamid system.  It was then placed in the urethral meatus and advanced into the bladder under direct visualization.  Prior cystoscopy had been done which noted normal anatomic landmarks.  These were again verified during cystoscopy today.  The cystoscope was brought back to the bladder neck and the needle was advanced through the needle guide at the 1 o'clock position.  Once it was visualized and advanced it was rotated to the 5 o'clock position.  Bulkamid was then injected until blood was seen.  This was then repeated at the 1 o'clock position in the 7 o'clock position until coaptation was noted.  Foley catheter was then reinserted.  Estrace  impregnated packing was then placed into her vagina which will be left in overnight.  The patient was subsequently extubated and returned to the PACU in excellent condition.   Morene MICAEL Salines, M.D.

## 2024-02-20 NOTE — Discharge Instructions (Signed)
  Driving:  It is against the law to drive when taking narcotic pain medications.  You should wait at least 8 hours after taking your last pain pill before driving.  Further, you should not drive if you are to sore to react quickly or if you have something impeding your ability to drive.   Activity:  You are encouraged to ambulate frequently (about every hour during waking hours) to help prevent blood clots from forming in your legs or lungs.  However, you should not engage in any heavy lifting (> 10-15 lbs), strenuous activity, or straining.  Diet: You should advance your diet as instructed by your physician.  It will be normal to have some bloating, nausea, and abdominal discomfort intermittently.  Prescriptions:  You will be provided a prescription for pain medication to take as needed.  If your pain is not severe enough to require the prescription pain medication, you may take extra strength Tylenol  instead which will have less side effects.  You should also take a prescribed stool softener to avoid straining with bowel movements as the prescription pain medication may constipate you.  Incisions: You may remove your dressing bandages 48 hours after surgery if not removed in the hospital.  You will either have some small staples or special tissue glue at each of the incision sites. Once the bandages are removed (if present), the incisions may stay open to air.  You may start showering (but not soaking or bathing in water) the 2nd day after surgery and the incisions simply need to be patted dry after the shower.  No additional care is needed.  What to call us  about: You should call the office [phone]) if you develop fever > 101 or develop persistent vomiting. Activity:  You are encouraged to ambulate frequently (about every hour during waking hours) to help prevent blood clots from forming in your legs or lungs.  However, you should not engage in any heavy lifting (> 10-15 lbs), strenuous activity,  or straining.

## 2024-02-20 NOTE — TOC Initial Note (Signed)
 Transition of Care [facility]) - Initial/Assessment Note    Patient Details  Name: Kerry Foster MRN: [id number] Date of Birth: April 17, 1940  Transition of Care [facility]) CM/SW Contact:    Bascom Service, RN Phone Number: 02/20/2024, 3:00 PM  Clinical Narrative:  d/c plan home.                 Expected Discharge Plan: Home/Self Care Barriers to Discharge: Continued Medical Work up   Patient Goals and CMS Choice Patient states their goals for this hospitalization and ongoing recovery are:: Home CMS Medicare.gov Compare Post Acute Care list provided to:: Patient Choice offered to / list presented to : Patient      Expected Discharge Plan and Services   Discharge Planning Services: CM Consult   Living arrangements for the past 2 months: Single Family Home                                      Prior Living Arrangements/Services Living arrangements for the past 2 months: Single Family Home Lives with:: Spouse                   Activities of Daily Living      Permission Sought/Granted                  Emotional Assessment              Admission diagnosis:  Other female genital prolapse [N81.89] 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] Patient Active Problem List   Diagnosis Date Noted   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   Urinary frequency 11/05/2022   Incontinence of feces 11/05/2022   Nocturia 11/05/2022   Urinary incontinence, mixed 11/05/2022   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 11/05/2022   History of UTI 11/05/2022   History of pelvic surgery 11/05/2022   Benign neoplasm of colon 12/01/2014   Angina decubitus 12/01/2014   Bouchard's nodes 12/01/2014   Degeneration of intervertebral disc of lumbar region 12/01/2014   Diverticulitis of intestine without perforation or abscess without bleeding 12/01/2014   DD (diverticular disease) 12/01/2014   Acid reflux 12/01/2014   Hypercholesterolemia 12/01/2014    Adiposity 12/01/2014   Obstructive apnea 12/01/2014   Allergic rhinitis, seasonal 12/01/2014   Sjogren's syndrome 12/01/2014   Systemic lupus erythematosus (HCC) 12/01/2014   DOE (dyspnea on exertion) 11/05/2014   Essential hypertension 10/28/2014   Chest pain 10/28/2014   BP (high blood pressure) 10/18/2014   PCP:  Elliot Charm, MD Pharmacy:   [facility] [place], [place] - [address] [place] [address] [place] [address] Phone: [phone] Fax: [phone]     Social Drivers of Health (SDOH) Social History: SDOH Screenings   Tobacco Use: Unknown (02/20/2024)   SDOH Interventions:     Readmission Risk Interventions     No data to display

## 2024-02-20 NOTE — Anesthesia Procedure Notes (Signed)
 Procedure Name: Intubation Date/Time: 02/20/2024 7:35 AM  Performed by: Epifanio Charleston, MDPre-anesthesia Checklist: Patient identified, Emergency Drugs available, Suction available and Patient being monitored Patient Re-evaluated:Patient Re-evaluated prior to induction Oxygen Delivery Method: Circle system utilized Preoxygenation: Pre-oxygenation with 100% oxygen Induction Type: IV induction Ventilation: Mask ventilation without difficulty Laryngoscope Size: Mac and 3 Grade View: Grade I Tube type: Oral Tube size: 7.0 mm Number of attempts: 1 Airway Equipment and Method: Stylet and Oral airway Placement Confirmation: ETT inserted through vocal cords under direct vision, positive ETCO2 and breath sounds checked- equal and bilateral Secured at: 21 cm Tube secured with: Tape Dental Injury: Teeth and Oropharynx as per pre-operative assessment

## 2024-02-20 NOTE — Anesthesia Postprocedure Evaluation (Signed)
"   Anesthesia Post Note  Patient: Kerry Foster  Procedure(s) Performed: SACROCOLPOPEXY, ROBOT-ASSISTED, LAPAROSCOPIC (Abdomen) CYSTOSCOPY INJECTION, BULKING AGENT, URETHRA     Patient location during evaluation: PACU Anesthesia Type: General Level of consciousness: awake and alert Pain management: pain level controlled Vital Signs Assessment: post-procedure vital signs reviewed and stable Respiratory status: spontaneous breathing, nonlabored ventilation, respiratory function stable and patient connected to nasal cannula oxygen Cardiovascular status: blood pressure returned to baseline and stable Postop Assessment: no apparent nausea or vomiting Anesthetic complications: no   No notable events documented.  Last Vitals:  Vitals:   02/20/24 1415 02/20/24 1440  BP: (!) 128/57 (!) 146/67  Pulse: 66 71  Resp: 15 17  Temp:  36.9 C  SpO2: 96% 98%    Last Pain:  Vitals:   02/20/24 1457  TempSrc:   PainSc: 4                  Epifanio Lamar BRAVO      "

## 2024-02-21 ENCOUNTER — Encounter (HOSPITAL_COMMUNITY): Payer: Self-pay | Admitting: Urology

## 2024-02-21 DIAGNOSIS — N8111 Cystocele, midline: Secondary | ICD-10-CM | POA: Diagnosis not present

## 2024-02-21 LAB — COMPREHENSIVE METABOLIC PANEL WITH GFR
ALT: 14 U/L (ref 0–44)
AST: 22 U/L (ref 15–41)
Albumin: 3.5 g/dL (ref 3.5–5.0)
Alkaline Phosphatase: 64 U/L (ref 38–126)
Anion gap: 9 (ref 5–15)
BUN: 16 mg/dL (ref 8–23)
CO2: 23 mmol/L (ref 22–32)
Calcium: 9.6 mg/dL (ref 8.9–10.3)
Chloride: 108 mmol/L (ref 98–111)
Creatinine, Ser: 0.76 mg/dL (ref 0.44–1.00)
GFR, Estimated: >60 mL/min
Glucose, Bld: 97 mg/dL (ref 70–99)
Potassium: 3.8 mmol/L (ref 3.5–5.1)
Sodium: 140 mmol/L (ref 135–145)
Total Bilirubin: 0.4 mg/dL (ref 0.0–1.2)
Total Protein: 5.4 g/dL — ABNORMAL LOW (ref 6.5–8.1)

## 2024-02-21 NOTE — Progress Notes (Signed)
 Pt has been up walking in her room and tolerating well. Only endorses what she thinks is slight gas discomfort in left upper abdomen. Urinary catheter removed at 0600, pt due to void. She tolerated clears with no nausea last night and is looking forward to eating a regular breakfast.

## 2024-02-21 NOTE — TOC Transition Note (Signed)
 Transition of Care [facility]) - Discharge Note   Patient Details  Name: RIANNE DEGRAAF MRN: [id number] Date of Birth: 1941-01-02  [facility] CM/SW Contact:  Bascom Service, RN Phone Number: 02/21/2024, 12:10 PM   Clinical Narrative: d/c home no CM needs.      Final next level of care: Home/Self Care Barriers to Discharge: No Barriers Identified   Patient Goals and CMS Choice Patient states their goals for this hospitalization and ongoing recovery are:: Home CMS Medicare.gov Compare Post Acute Care list provided to:: Patient Choice offered to / list presented to : Patient      Discharge Placement                       Discharge Plan and Services Additional resources added to the After Visit Summary for     Discharge Planning Services: CM Consult                                 Social Drivers of Health (SDOH) Interventions SDOH Screenings   Food Insecurity: No Food Insecurity (02/20/2024)  Housing: Low Risk (02/20/2024)  Transportation Needs: No Transportation Needs (02/20/2024)  Utilities: Not At Risk (02/20/2024)  Social Connections: Socially Integrated (02/20/2024)  Tobacco Use: Unknown (02/20/2024)     Readmission Risk Interventions     No data to display

## 2024-02-21 NOTE — Discharge Summary (Signed)
 Date of admission: 02/20/2024  Date of discharge: 02/21/2024  Admission diagnosis: Pelvic organ prolapse  Discharge diagnosis: Pelvic organ prolapse  Secondary diagnoses: None  History and Physical: For full details, please see admission history and physical. Briefly, Kerry Foster is a 84 y.o. year old patient with pelvic organ prolapse.   Hospital Course:  Patient underwent robotic assisted sacrocolpopexy on 1/29, tolerated the procedure well, ambulated well and tolerated a diet at time of discharge without nausea/vomiting. Pain well controlled, voiding spontaneously. Pain medications and antibiotics sent to home pharmacy  Laboratory values: No results for input(s): HGB, HCT in the last 72 hours. Recent Labs    02/21/24 0654  CREATININE 0.76    Disposition: Home  Discharge instruction: The patient was instructed to be ambulatory but told to refrain from heavy lifting, strenuous activity, or driving.   Discharge medications:  Allergies as of 02/21/2024       Reactions   Diuretic [buchu-cornsilk-ch Grass-hydran] Other (See Comments)   Causes lupus flare   Hctz [hydrochlorothiazide] Dermatitis   Lisinopril Cough   Diovan [valsartan] Rash        Medication List     TAKE these medications    acetaminophen  500 MG tablet Commonly known as: TYLENOL  Take 500-1,000 mg by mouth every 6 (six) hours as needed (pain.).   amLODipine  5 MG tablet Commonly known as: NORVASC  Take 5 mg by mouth in the morning.   cholecalciferol 1000 units tablet Commonly known as: VITAMIN D Take 1,000 Units by mouth in the morning.   estradiol  0.01 % Crea vaginal cream Commonly known as: ESTRACE  Place 1 Applicatorful vaginally every other day.   furosemide  20 MG tablet Commonly known as: LASIX  Take 1 tablet (20 mg total) by mouth daily. What changed:  when to take this reasons to take this   hydroxychloroquine  200 MG tablet Commonly known as: PLAQUENIL  Take 400 mg by mouth in the  morning.   multivitamin with minerals Tabs tablet Take 1 tablet by mouth in the morning.   sulfamethoxazole -trimethoprim  800-160 MG tablet Commonly known as: BACTRIM  DS Take 1 tablet by mouth 2 (two) times daily. Start taking one day prior to your appointment for your first follow-up and catheter removal.  Continue taking for three days.   Systane Ultra PF 0.4-0.3 % Soln Generic drug: Polyethyl Glyc-Propyl Glyc PF Place 1 drop into both eyes 3 (three) times daily as needed (dry/irritated eyes.).   traMADol  50 MG tablet Commonly known as: Ultram  Take 1-2 tablets (50-100 mg total) by mouth every 6 (six) hours as needed for moderate pain (pain score 4-6).   trimethoprim  100 MG tablet Commonly known as: TRIMPEX  Take 100 mg by mouth at bedtime.   TWO [place] ARTHRITIS EX Apply 1 Application topically as needed (pain.).        Followup:   Follow-up Information     Buck Rodena BIRCH, NP Follow up on 03/05/2024.   Specialty: Urology Why: 3:30p Contact information: [address] [place] [place] [address] [phone]

## 2024-02-21 NOTE — Progress Notes (Signed)
 Patient received discharge orders to go home. Patient was given discharge paperwork/instructions. RN went over discharge paperwork/instructions with the patient and patient's spouse. Any questions/concerns were addressed/answered to the best of RN's ability during this time. Patient left the hospital stable, had discharge paperwork/instructions, and had all personal belongings.

## 2024-02-21 NOTE — Progress Notes (Signed)
" °   02/21/24 0837  Urine Measurement/Characteristics  Urine (mL) 100 mL  Urinary Incontinence No  Urine Color Yellow/straw  Urine Appearance Clear  Urinary Interventions Bladder scan;Post void cath residual;Post void residual  Bladder Scan Volume (mL) 0 mL  Post Void Cath Residual (mL) 0 mL  Post Void Residual 0 mL    "
